# Patient Record
Sex: Male | Born: 1960 | Race: White | Hispanic: No | Marital: Single | Smoking: Former smoker
Health system: Southern US, Community
[De-identification: ages and names within clinical notes are randomized; demographics above are authoritative.]

## PROBLEM LIST (undated history)

## (undated) DIAGNOSIS — Z87442 Personal history of urinary calculi: Secondary | ICD-10-CM

## (undated) DIAGNOSIS — K219 Gastro-esophageal reflux disease without esophagitis: Secondary | ICD-10-CM

## (undated) DIAGNOSIS — S069X9A Unspecified intracranial injury with loss of consciousness of unspecified duration, initial encounter: Secondary | ICD-10-CM

## (undated) DIAGNOSIS — S069XAA Unspecified intracranial injury with loss of consciousness status unknown, initial encounter: Secondary | ICD-10-CM

## (undated) DIAGNOSIS — R569 Unspecified convulsions: Secondary | ICD-10-CM

## (undated) DIAGNOSIS — K759 Inflammatory liver disease, unspecified: Secondary | ICD-10-CM

## (undated) DIAGNOSIS — I10 Essential (primary) hypertension: Secondary | ICD-10-CM

## (undated) HISTORY — PX: HERNIA REPAIR: SHX51

---

## 2018-12-16 ENCOUNTER — Inpatient Hospital Stay (HOSPITAL_COMMUNITY)
Admission: EM | Admit: 2018-12-16 | Discharge: 2018-12-18 | DRG: 312 | Disposition: A | Discharge status: Home / Self Care | Payer: Medicaid - Out of State | Attending: Internal Medicine | Admitting: Internal Medicine

## 2018-12-16 ENCOUNTER — Other Ambulatory Visit: Payer: Self-pay

## 2018-12-16 ENCOUNTER — Encounter (HOSPITAL_COMMUNITY): Payer: Self-pay | Admitting: Emergency Medicine

## 2018-12-16 DIAGNOSIS — A084 Viral intestinal infection, unspecified: Secondary | ICD-10-CM | POA: Diagnosis present

## 2018-12-16 DIAGNOSIS — N400 Enlarged prostate without lower urinary tract symptoms: Secondary | ICD-10-CM

## 2018-12-16 DIAGNOSIS — N182 Chronic kidney disease, stage 2 (mild): Secondary | ICD-10-CM | POA: Diagnosis present

## 2018-12-16 DIAGNOSIS — E860 Dehydration: Secondary | ICD-10-CM

## 2018-12-16 DIAGNOSIS — Z87891 Personal history of nicotine dependence: Secondary | ICD-10-CM

## 2018-12-16 DIAGNOSIS — R42 Dizziness and giddiness: Secondary | ICD-10-CM

## 2018-12-16 DIAGNOSIS — I10 Essential (primary) hypertension: Secondary | ICD-10-CM

## 2018-12-16 DIAGNOSIS — I951 Orthostatic hypotension: Principal | ICD-10-CM

## 2018-12-16 DIAGNOSIS — Z87442 Personal history of urinary calculi: Secondary | ICD-10-CM

## 2018-12-16 DIAGNOSIS — K219 Gastro-esophageal reflux disease without esophagitis: Secondary | ICD-10-CM

## 2018-12-16 DIAGNOSIS — Z79899 Other long term (current) drug therapy: Secondary | ICD-10-CM

## 2018-12-16 DIAGNOSIS — Z8249 Family history of ischemic heart disease and other diseases of the circulatory system: Secondary | ICD-10-CM

## 2018-12-16 DIAGNOSIS — N200 Calculus of kidney: Secondary | ICD-10-CM

## 2018-12-16 DIAGNOSIS — I129 Hypertensive chronic kidney disease with stage 1 through stage 4 chronic kidney disease, or unspecified chronic kidney disease: Secondary | ICD-10-CM | POA: Diagnosis present

## 2018-12-16 DIAGNOSIS — N179 Acute kidney failure, unspecified: Secondary | ICD-10-CM

## 2018-12-16 HISTORY — DX: Personal history of urinary calculi: Z87.442

## 2018-12-16 HISTORY — DX: Gastro-esophageal reflux disease without esophagitis: K21.9

## 2018-12-16 HISTORY — DX: Essential (primary) hypertension: I10

## 2018-12-16 HISTORY — DX: Inflammatory liver disease, unspecified: K75.9

## 2018-12-16 LAB — COMPREHENSIVE METABOLIC PANEL
ALT: 12 U/L (ref 0–44)
AST: 14 U/L — ABNORMAL LOW (ref 15–41)
Albumin: 4.3 g/dL (ref 3.5–5.0)
Alkaline Phosphatase: 119 U/L (ref 38–126)
Anion gap: 11 (ref 5–15)
BUN: 27 mg/dL — ABNORMAL HIGH (ref 6–20)
CO2: 27 mmol/L (ref 22–32)
Calcium: 10.2 mg/dL (ref 8.9–10.3)
Chloride: 100 mmol/L (ref 98–111)
Creatinine, Ser: 1.68 mg/dL — ABNORMAL HIGH (ref 0.61–1.24)
GFR calc Af Amer: 51 mL/min — ABNORMAL LOW (ref >60)
GFR calc non Af Amer: 44 mL/min — ABNORMAL LOW (ref >60)
Glucose, Bld: 112 mg/dL — ABNORMAL HIGH (ref 70–99)
Potassium: 4.5 mmol/L (ref 3.5–5.1)
Sodium: 138 mmol/L (ref 135–145)
Total Bilirubin: 0.6 mg/dL (ref 0.3–1.2)
Total Protein: 8.2 g/dL — ABNORMAL HIGH (ref 6.5–8.1)

## 2018-12-16 LAB — CBC WITH DIFFERENTIAL/PLATELET
Abs Immature Granulocytes: 0.03 10*3/uL (ref 0.00–0.07)
Basophils Absolute: 0.1 10*3/uL (ref 0.0–0.1)
Basophils Relative: 1 %
Eosinophils Absolute: 0.2 10*3/uL (ref 0.0–0.5)
Eosinophils Relative: 3 %
HCT: 51.5 % (ref 39.0–52.0)
Hemoglobin: 17 g/dL (ref 13.0–17.0)
Immature Granulocytes: 0 %
Lymphocytes Relative: 22 %
Lymphs Abs: 1.8 10*3/uL (ref 0.7–4.0)
MCH: 29.8 pg (ref 26.0–34.0)
MCHC: 33 g/dL (ref 30.0–36.0)
MCV: 90.4 fL (ref 80.0–100.0)
Monocytes Absolute: 0.4 10*3/uL (ref 0.1–1.0)
Monocytes Relative: 6 %
Neutro Abs: 5.3 10*3/uL (ref 1.7–7.7)
Neutrophils Relative %: 68 %
Platelets: 246 10*3/uL (ref 150–400)
RBC: 5.7 MIL/uL (ref 4.22–5.81)
RDW: 12.8 % (ref 11.5–15.5)
WBC: 7.8 10*3/uL (ref 4.0–10.5)
nRBC: 0 % (ref 0.0–0.2)

## 2018-12-16 MED ORDER — ALUM & MAG HYDROXIDE-SIMETH 200-200-20 MG/5ML PO SUSP
30.0000 mL | ORAL | Status: DC | PRN
  Administered 2018-12-16 – 2018-12-17 (×3): 30 mL via ORAL
  Filled 2018-12-16 (×3): qty 30

## 2018-12-16 MED ORDER — ALUM & MAG HYDROXIDE-SIMETH 200-200-20 MG/5ML PO SUSP
30.0000 mL | Freq: Once | ORAL | Status: AC
  Administered 2018-12-16: 12:00:00 30 mL via ORAL

## 2018-12-16 MED ORDER — ALUM & MAG HYDROXIDE-SIMETH 200-200-20 MG/5ML PO SUSP
ORAL | Status: AC
  Filled 2018-12-16: qty 30

## 2018-12-16 MED ORDER — MECLIZINE HCL 12.5 MG PO TABS
25.0000 mg | ORAL_TABLET | Freq: Two times a day (BID) | ORAL | Status: DC
  Administered 2018-12-16 – 2018-12-18 (×4): 25 mg via ORAL
  Filled 2018-12-16 (×4): qty 2

## 2018-12-16 MED ORDER — ACETAMINOPHEN 325 MG PO TABS: 650.0000 mg | ORAL_TABLET | Freq: Four times a day (QID) | ORAL | Status: DC | PRN

## 2018-12-16 MED ORDER — POLYETHYLENE GLYCOL 3350 17 G PO PACK: 17.0000 g | PACK | Freq: Every day | ORAL | Status: DC | PRN

## 2018-12-16 MED ORDER — ENOXAPARIN SODIUM 40 MG/0.4ML ~~LOC~~ SOLN
40.0000 mg | SUBCUTANEOUS | Status: DC
  Administered 2018-12-16 – 2018-12-17 (×2): 40 mg via SUBCUTANEOUS
  Filled 2018-12-16: qty 0.4

## 2018-12-16 MED ORDER — SODIUM CHLORIDE 0.9 % IV SOLN
Freq: Once | INTRAVENOUS | Status: AC
  Administered 2018-12-16: 16:00:00 via INTRAVENOUS

## 2018-12-16 MED ORDER — SODIUM CHLORIDE 0.9 % IV BOLUS
500.0000 mL | Freq: Once | INTRAVENOUS | Status: AC
  Administered 2018-12-16: 12:00:00 500 mL via INTRAVENOUS

## 2018-12-16 MED ORDER — ACETAMINOPHEN 650 MG RE SUPP: 650.0000 mg | Freq: Four times a day (QID) | RECTAL | Status: DC | PRN

## 2018-12-16 MED ORDER — ALUM & MAG HYDROXIDE-SIMETH 200-200-20 MG/5ML PO SUSP
30.0000 mL | Freq: Once | ORAL | Status: AC
  Administered 2018-12-16: 17:00:00 30 mL via ORAL
  Filled 2018-12-16: qty 30

## 2018-12-16 MED ORDER — SODIUM CHLORIDE 0.9 % IV SOLN
INTRAVENOUS | Status: AC
  Administered 2018-12-16 – 2018-12-17 (×2): via INTRAVENOUS

## 2018-12-16 MED ORDER — SODIUM CHLORIDE 0.9 % IV BOLUS
500.0000 mL | Freq: Once | INTRAVENOUS | Status: AC
  Administered 2018-12-16: 13:00:00 500 mL via INTRAVENOUS

## 2018-12-16 MED ORDER — ONDANSETRON HCL 4 MG/2ML IJ SOLN
4.0000 mg | Freq: Four times a day (QID) | INTRAMUSCULAR | Status: DC | PRN
  Administered 2018-12-17: 4 mg via INTRAVENOUS
  Filled 2018-12-16: qty 2

## 2018-12-16 MED ORDER — SODIUM CHLORIDE 0.9 % IV BOLUS
1000.0000 mL | Freq: Once | INTRAVENOUS | Status: AC
  Administered 2018-12-16: 1000 mL via INTRAVENOUS

## 2018-12-16 MED ORDER — TRAMADOL HCL 50 MG PO TABS
50.0000 mg | ORAL_TABLET | Freq: Four times a day (QID) | ORAL | Status: DC | PRN
  Administered 2018-12-16 – 2018-12-17 (×2): 50 mg via ORAL
  Filled 2018-12-16 (×2): qty 1

## 2018-12-16 MED ORDER — AMITRIPTYLINE HCL 25 MG PO TABS
100.0000 mg | ORAL_TABLET | Freq: Every day | ORAL | Status: DC
  Administered 2018-12-16 – 2018-12-17 (×2): 100 mg via ORAL
  Filled 2018-12-16 (×2): qty 4

## 2018-12-16 MED ORDER — DIAZEPAM 5 MG PO TABS
10.0000 mg | ORAL_TABLET | Freq: Four times a day (QID) | ORAL | Status: DC | PRN
  Administered 2018-12-16 – 2018-12-17 (×2): 10 mg via ORAL
  Filled 2018-12-16 (×2): qty 2

## 2018-12-16 MED ORDER — ONDANSETRON HCL 4 MG PO TABS: 4.0000 mg | ORAL_TABLET | Freq: Four times a day (QID) | ORAL | Status: DC | PRN

## 2018-12-16 MED ORDER — TRAMADOL HCL 50 MG PO TABS: 50.0000 mg | ORAL_TABLET | Freq: Four times a day (QID) | ORAL | Status: DC | PRN

## 2018-12-16 MED ORDER — MECLIZINE HCL 12.5 MG PO TABS
25.0000 mg | ORAL_TABLET | Freq: Once | ORAL | Status: DC
  Administered 2018-12-16: 12:00:00 25 mg via ORAL
  Filled 2018-12-16: qty 2

## 2018-12-16 MED ORDER — PANTOPRAZOLE SODIUM 40 MG PO TBEC
40.0000 mg | DELAYED_RELEASE_TABLET | Freq: Every day | ORAL | Status: DC
  Administered 2018-12-16 – 2018-12-18 (×3): 40 mg via ORAL
  Filled 2018-12-16 (×3): qty 1

## 2018-12-16 NOTE — ED Provider Notes (Signed)
[facility] EMERGENCY DEPARTMENT Provider Note   CSN: [id number] Arrival date & time: 12/16/18  1055    History   Chief Complaint Chief Complaint  Patient presents with  . Dizziness    HPI Javier Rose is a 58 y.o. male.     HPI Patient states he has had 3 days of dizziness which he describes as spinning sensation.  Worse with standing.  He has had bilateral tinnitus.  Denies focal weakness or numbness.  States he has been more tremulous and had a dry mouth.  Quit smoking roughly 3 weeks ago.  States at that time he was having some increased anxiety but that has since resolved.  Denies any new medications or changing of dosing of medications.  Denies alcohol or other drug intake.  No fever or chills.  Denies nausea or vomiting. History reviewed. No pertinent past medical history.  There are no active problems to display for this patient.   History reviewed. No pertinent surgical history.      Home Medications    Prior to Admission medications   Medication Sig Start Date End Date Taking? Authorizing Provider  amitriptyline (ELAVIL) 100 MG tablet Take 100 mg by mouth at bedtime.   Yes [provider]  chlorthalidone (HYGROTON) 50 MG tablet Take 50 mg by mouth daily.   Yes [provider]  diazepam (VALIUM) 10 MG tablet Take 10 mg by mouth every 6 (six) hours as needed for anxiety.   Yes [provider]  esomeprazole (NEXIUM) 20 MG capsule Take 20 mg by mouth daily at 12 noon.   Yes [provider]  lisinopril (PRINIVIL,ZESTRIL) 20 MG tablet Take 20 mg by mouth daily.   Yes [provider]    Family History History reviewed. No pertinent family history.  Social History Social History   Tobacco Use  . Smoking status: Former Games developermoker  . Smokeless tobacco: Never Used  Substance Use Topics  . Alcohol use: Never    Frequency: Never  . Drug use: Never     Allergies   Patient has no allergy information on record.    Review of Systems Review of Systems  Constitutional: Negative for chills, fatigue and fever.  HENT: Positive for tinnitus. Negative for sore throat.   Eyes: Negative for visual disturbance.  Respiratory: Negative for cough and shortness of breath.   Cardiovascular: Negative for chest pain.  Gastrointestinal: Negative for abdominal pain, diarrhea, nausea and vomiting.  Genitourinary: Negative for dysuria, flank pain and frequency.  Musculoskeletal: Positive for gait problem. Negative for back pain, myalgias and neck pain.  Skin: Negative for rash and wound.  Neurological: Positive for dizziness and light-headedness. Negative for speech difficulty, weakness, numbness and headaches.  All other systems reviewed and are negative.    Physical Exam Updated Vital Signs BP 103/81   Pulse 64   Temp 97.7 F (36.5 C) (Oral)   Resp 16   Ht 5\' 9"  (1.753 m)   Wt 77.1 kg   SpO2 98%   BMI 25.10 kg/m   Physical Exam Vitals signs and nursing note reviewed.  Constitutional:      Appearance: Normal appearance. He is well-developed.  HENT:     Head: Normocephalic and atraumatic.     Right Ear: Tympanic membrane normal.     Left Ear: Tympanic membrane normal.     Nose: Nose normal.     Mouth/Throat:     Mouth: Mucous membranes are dry.  Eyes:     Extraocular Movements:  Extraocular movements intact.     Pupils: Pupils are equal, round, and reactive to light.     Comments: Few beats of horizontal nystagmus especially when looking left.  Neck:     Musculoskeletal: Normal range of motion and neck supple. No neck rigidity or muscular tenderness.  Cardiovascular:     Rate and Rhythm: Normal rate and regular rhythm.     Heart sounds: No murmur. No friction rub. No gallop.   Pulmonary:     Effort: Pulmonary effort is normal. No respiratory distress.     Breath sounds: Normal breath sounds. No stridor. No wheezing, rhonchi or rales.  Chest:     Chest wall: No tenderness.  Abdominal:      General: Bowel sounds are normal.     Palpations: Abdomen is soft.     Tenderness: There is no abdominal tenderness. There is no guarding or rebound.  Musculoskeletal: Normal range of motion.        General: No swelling, tenderness, deformity or signs of injury.     Right lower leg: No edema.     Left lower leg: No edema.  Lymphadenopathy:     Cervical: No cervical adenopathy.  Skin:    General: Skin is warm and dry.     Findings: No erythema or rash.  Neurological:     General: No focal deficit present.     Mental Status: He is alert and oriented to person, place, and time.     Comments: Patient is alert and oriented x3 with clear, goal oriented speech. Patient has 5/5 motor in all extremities. Sensation is intact to light touch. Bilateral finger-to-nose is normal with no signs of dysmetria.   Psychiatric:        Behavior: Behavior normal.     Comments: Mildly anxious appearing      ED Treatments / Results  Labs (all labs ordered are listed, but only abnormal results are displayed) Labs Reviewed  COMPREHENSIVE METABOLIC PANEL - Abnormal; Notable for the following components:      Result Value   Glucose, Bld 112 (*)    BUN 27 (*)    Creatinine, Ser 1.68 (*)    Total Protein 8.2 (*)    AST 14 (*)    GFR calc non Af Amer 44 (*)    GFR calc Af Amer 51 (*)    All other components within normal limits  CBC WITH DIFFERENTIAL/PLATELET    EKG EKG Interpretation  Date/Time:  Tuesday December 16 2018 11:00:56 EDT Ventricular Rate:  60 PR Interval:    QRS Duration: 112 QT Interval:  459 QTC Calculation: 459 R Axis:   89 Text Interpretation:  Sinus rhythm Short PR interval Consider right atrial enlargement Probable lateral infarct, old Artifact in lead(s) I III aVR aVL Confirmed by Loren Racer ([id number]) on 12/16/2018 11:38:54 AM   Radiology No results found.  Procedures Procedures (including critical care time)  Medications Ordered in ED Medications  meclizine  (ANTIVERT) tablet 25 mg (25 mg Oral Given 12/16/18 1136)  sodium chloride 0.9 % bolus 500 mL (0 mLs Intravenous Stopped 12/16/18 1223)  alum & mag hydroxide-simeth (MAALOX/MYLANTA) 200-200-20 MG/5ML suspension 30 mL (30 mLs Oral Given 12/16/18 1211)  sodium chloride 0.9 % bolus 500 mL (0 mLs Intravenous Stopped 12/16/18 1349)  sodium chloride 0.9 % bolus 1,000 mL (0 mLs Intravenous Stopped 12/16/18 1520)  0.9 %  sodium chloride infusion ( Intravenous New Bag/Given 12/16/18 1531)     Initial Impression / Assessment and  Plan / ED Course  I have reviewed the triage vital signs and the nursing notes.  Pertinent labs & imaging results that were available during my care of the patient were reviewed by me and considered in my medical decision making (see chart for details).        Patient was profoundly orthostatic with standing.  Given 2 L of IV fluids.  Continues to be orthostatic with systolic blood pressure dropping into the 70s.  Patient does have an AKI.  Unsure his baseline renal function.  Discussed with hospitalist and will see patient in the emergency department.  Symptoms likely related to dehydration.  Final Clinical Impressions(s) / ED Diagnoses   Final diagnoses:  Orthostatic hypotension  AKI (acute kidney injury) [facility])  Dehydration    ED Discharge Orders    None       Loren Racer, MD 12/16/18 1545

## 2018-12-16 NOTE — ED Triage Notes (Signed)
Pt c/o of dizziness and shaking x 3 days.  Pt states he stopped smoking 3 weeks ago. VSS

## 2018-12-16 NOTE — ED Notes (Signed)
Dr. Ranae Palms notified of BP and +orthostatis, started 2nd 500 ml bolus

## 2018-12-16 NOTE — H&P (Signed)
History and Physical    Javier Rose [id number] DOB: Nov 13, 1960 DOA: 12/16/2018  PCP: Patient, No Pcp Per   Patient coming from: Home  I have personally briefly [facility]  Chief Complaint: Dizziness  HPI: Javier Cashommy Becht is a 58 y.o. male with medical history significant for HTN, presented to the Ed with c/o 2 weeks of dizziness. Dizziness is present only when standing. No extremity weakness. No vomiting or loose stools. He has maintained good PO intake except for the past 2 days. No chest pain. He denies sick contacts, no fever,no cough or shortness of breath, no sore-throat, he has been home for the past 2 weeks except for grocery shopping.  ED Course: Initial stable vitals, but with orthostatic hypotension. Blood pressure systolic dropped from 128/93 lying to 65/42 standing.  Unremarkable CBC.  Creatinine elevated 1.68, BUN- 27.  Shows Q waves lead II, III, with artifacts.  Patient given 1.5 L bolus, 25 mg meclizine, repeat orthostatics showed blood pressure drop to 72/58 on standing.  Hospitalist to admit for symptomatic orthostatic hypotension and probable acute kidney injury.  Review of Systems: As per HPI all other systems reviewed and negative.  Medical history-hypertension, tobacco abuse.  History reviewed. No pertinent surgical history.   reports that he has quit smoking. He has never used smokeless tobacco. He reports that he does not drink alcohol or use drugs.  Not on File  Family history of HTN.  Prior to Admission medications   Medication Sig Start Date End Date Taking? Authorizing Provider  amitriptyline (ELAVIL) 100 MG tablet Take 100 mg by mouth at bedtime.   Yes [provider]  chlorthalidone (HYGROTON) 50 MG tablet Take 50 mg by mouth daily.   Yes [provider]  diazepam (VALIUM) 10 MG tablet Take 10 mg by mouth every 6 (six) hours as needed for anxiety.   Yes [provider]   esomeprazole (NEXIUM) 20 MG capsule Take 20 mg by mouth daily at 12 noon.   Yes [provider]  lisinopril (PRINIVIL,ZESTRIL) 20 MG tablet Take 20 mg by mouth daily.   Yes [provider]    Physical Exam: Vitals:   12/16/18 1101 12/16/18 1300 12/16/18 1500 12/16/18 1530  BP: (!) 133/102 (!) 158/83 (!) 156/87 103/81  Pulse: 64     Resp: 16 19  16   Temp: 97.7 F (36.5 C)     TempSrc: Oral     SpO2: 98%     Weight:      Height:        Constitutional: NAD, calm, comfortable Vitals:   12/16/18 1101 12/16/18 1300 12/16/18 1500 12/16/18 1530  BP: (!) 133/102 (!) 158/83 (!) 156/87 103/81  Pulse: 64     Resp: 16 19  16   Temp: 97.7 F (36.5 C)     TempSrc: Oral     SpO2: 98%     Weight:      Height:       Eyes: PERRL, lids and conjunctivae normal ENMT: Mucous membranes are moist. Posterior pharynx clear of any exudate or lesions. Neck: normal, supple, no masses, no thyromegaly Respiratory: Normal respiratory effort. No accessory muscle use.  Cardiovascular: Regular rate and rhythm, No extremity edema. 2+ pedal pulses. Abdomen: no tenderness, no masses palpated. No hepatosplenomegaly. Bowel sounds positive.  Musculoskeletal: no clubbing / cyanosis. No joint deformity upper and lower extremities. Good ROM, no contractures. Normal muscle tone.  Skin: no rashes, lesions, ulcers. No induration Neurologic:  CN 2-12 grossly intact. Sensation intact, DTR normal. Strength 5/5 in all 4.  Psychiatric: Normal judgment and insight. Alert and oriented x 3. Normal mood.   Labs on Admission: I have personally reviewed following labs and imaging studies  CBC: Recent Labs  Lab 12/16/18 1130  WBC 7.8  NEUTROABS 5.3  HGB 17.0  HCT 51.5  MCV 90.4  PLT 246   Basic Metabolic Panel: Recent Labs  Lab 12/16/18 1130  NA 138  K 4.5  CL 100  CO2 27  GLUCOSE 112*  BUN 27*  CREATININE 1.68*  CALCIUM 10.2   Liver Function Tests: Recent Labs  Lab 12/16/18 1130  AST  14*  ALT 12  ALKPHOS 119  BILITOT 0.6  PROT 8.2*  ALBUMIN 4.3    Radiological Exams on Admission: No results found.  EKG: Independently reviewed.  Artifacts present.  Sinus rhythm.  QTc 459.  Q waves 2 3 aVF.  T waves appear peaked diffusely but no old EKG to compare.  Assessment/Plan Active Problems:   Dizziness   Dizziness- likely secondary to orthostatic hypotension, likely from diuretics. 1.5L fluids given in ED, with persistent orthostasis- initial blood pressure 128/93 pulse 63 lying, and standing 65/42 pulse 82.  No focal neurologic deficits. -Hold home chlorthalidone 50 and lisinopril 20mg  -Continue IV fluid normal saline 125 cc/h X 15hrs -PT evaluation if available -Continue meclizine as needed  Probable acute kidney injury-no baseline to compare.  Creatinine 1.68, BUN 27. Denies kidney disease, but tells me he has had several kidney stones and BPH. -Hold chlorthalidone and lisinopril -Hydrate -BMP a.m.  Hypertension-Systolic 103 to 150s. -Hold home antihypertensives at this time  Tobacco abuse-quit smoking 3 weeks ago.  DVT prophylaxis: Lovenox Code Status: Full Family Communication: None at bedside Disposition Plan: 1-2 days Consults called: None Admission status: Obs, Med-surg   Onnie Boer MD [facility]  12/16/2018, 3:57 PM

## 2018-12-17 DIAGNOSIS — I129 Hypertensive chronic kidney disease with stage 1 through stage 4 chronic kidney disease, or unspecified chronic kidney disease: Secondary | ICD-10-CM | POA: Diagnosis present

## 2018-12-17 DIAGNOSIS — K219 Gastro-esophageal reflux disease without esophagitis: Secondary | ICD-10-CM | POA: Diagnosis present

## 2018-12-17 DIAGNOSIS — A084 Viral intestinal infection, unspecified: Secondary | ICD-10-CM | POA: Diagnosis present

## 2018-12-17 DIAGNOSIS — I951 Orthostatic hypotension: Principal | ICD-10-CM

## 2018-12-17 DIAGNOSIS — N200 Calculus of kidney: Secondary | ICD-10-CM

## 2018-12-17 DIAGNOSIS — N182 Chronic kidney disease, stage 2 (mild): Secondary | ICD-10-CM | POA: Diagnosis present

## 2018-12-17 DIAGNOSIS — N179 Acute kidney failure, unspecified: Secondary | ICD-10-CM | POA: Diagnosis not present

## 2018-12-17 DIAGNOSIS — E860 Dehydration: Secondary | ICD-10-CM

## 2018-12-17 DIAGNOSIS — Z79899 Other long term (current) drug therapy: Secondary | ICD-10-CM | POA: Diagnosis not present

## 2018-12-17 DIAGNOSIS — I10 Essential (primary) hypertension: Secondary | ICD-10-CM

## 2018-12-17 DIAGNOSIS — Z87442 Personal history of urinary calculi: Secondary | ICD-10-CM | POA: Diagnosis not present

## 2018-12-17 DIAGNOSIS — N400 Enlarged prostate without lower urinary tract symptoms: Secondary | ICD-10-CM | POA: Diagnosis not present

## 2018-12-17 DIAGNOSIS — Z8249 Family history of ischemic heart disease and other diseases of the circulatory system: Secondary | ICD-10-CM | POA: Diagnosis not present

## 2018-12-17 DIAGNOSIS — R42 Dizziness and giddiness: Secondary | ICD-10-CM | POA: Diagnosis not present

## 2018-12-17 DIAGNOSIS — Z87891 Personal history of nicotine dependence: Secondary | ICD-10-CM | POA: Diagnosis not present

## 2018-12-17 LAB — BASIC METABOLIC PANEL
Anion gap: 8 (ref 5–15)
BUN: 26 mg/dL — ABNORMAL HIGH (ref 6–20)
CO2: 27 mmol/L (ref 22–32)
Calcium: 9 mg/dL (ref 8.9–10.3)
Chloride: 105 mmol/L (ref 98–111)
Creatinine, Ser: 1.55 mg/dL — ABNORMAL HIGH (ref 0.61–1.24)
GFR calc Af Amer: 56 mL/min — ABNORMAL LOW (ref >60)
GFR calc non Af Amer: 49 mL/min — ABNORMAL LOW (ref >60)
Glucose, Bld: 109 mg/dL — ABNORMAL HIGH (ref 70–99)
Potassium: 4.6 mmol/L (ref 3.5–5.1)
Sodium: 140 mmol/L (ref 135–145)

## 2018-12-17 LAB — HIV ANTIBODY (ROUTINE TESTING W REFLEX): HIV Screen 4th Generation wRfx: NONREACTIVE

## 2018-12-17 MED ORDER — SACCHAROMYCES BOULARDII 250 MG PO CAPS
250.0000 mg | ORAL_CAPSULE | Freq: Two times a day (BID) | ORAL | Status: DC
  Administered 2018-12-17 – 2018-12-18 (×3): 250 mg via ORAL
  Filled 2018-12-17 (×3): qty 1

## 2018-12-17 MED ORDER — SODIUM CHLORIDE 0.9 % IV SOLN
INTRAVENOUS | Status: AC
  Administered 2018-12-17 – 2018-12-18 (×2): via INTRAVENOUS

## 2018-12-17 NOTE — Evaluation (Signed)
Physical Therapy Evaluation Patient Details Name: Javier Rose MRN: [id number] DOB: 01-Nov-1960 Today's Date: 12/17/2018   History of Present Illness  Javier Rose is a 58 y.o. male with medical history significant for HTN, presented to the Ed with c/o 2 weeks of dizziness. Dizziness is present only when standing. No extremity weakness. No vomiting or loose stools. He has maintained good PO intake except for the past 2 days. No chest pain. He denies sick contacts, no fever,no cough or shortness of breath, no sore-throat, he has been home for the past 2 weeks except for grocery shopping.    Clinical Impression  Patient functioning at baseline for functional mobility and gait, demonstrates safe ambulation in room and hallways and tolerated sitting up in chair after therapy - RN notified.  Plan:  Patient discharged from physical therapy to care of nursing for ambulation daily as tolerated for length of stay.     Follow Up Recommendations No PT follow up    Equipment Recommendations  None recommended by PT    Recommendations for Other Services       Precautions / Restrictions Precautions Precautions: None Restrictions Weight Bearing Restrictions: No      Mobility  Bed Mobility Overal bed mobility: Independent                Transfers Overall transfer level: Independent                  Ambulation/Gait Ambulation/Gait assistance: Modified independent (Device/Increase time) Gait Distance (Feet): 200 Feet Assistive device: None Gait Pattern/deviations: Decreased dorsiflexion - right;WFL(Within Functional Limits);Steppage Gait velocity: slightly decreased   General Gait Details: grossly WFL except mild right foot slap due to baseline weakness in right ankle dorsiflexors, no loss of balance  Stairs            Wheelchair Mobility    Modified Rankin (Stroke Patients Only)       Balance Overall balance assessment: No apparent balance deficits (not  formally assessed)                                           Pertinent Vitals/Pain Pain Assessment: No/denies pain    Home Living Family/patient expects to be discharged to:: Private residence Living Arrangements: Other relatives(sister) Available Help at Discharge: Family;Available PRN/intermittently Type of Home: Mobile home Home Access: Stairs to enter Entrance Stairs-Rails: Right Entrance Stairs-Number of Steps: 2 Home Layout: One level Home Equipment: None      Prior Function Level of Independence: Independent               Hand Dominance        Extremity/Trunk Assessment   Upper Extremity Assessment Upper Extremity Assessment: Overall WFL for tasks assessed    Lower Extremity Assessment Lower Extremity Assessment: Overall WFL for tasks assessed;RLE deficits/detail RLE Deficits / Details: ankle dorsiflexion grossly 3/5 (base line since fall over 1 year ago per patient)    Cervical / Trunk Assessment Cervical / Trunk Assessment: Normal  Communication   Communication: No difficulties  Cognition Arousal/Alertness: Awake/alert Behavior During Therapy: WFL for tasks assessed/performed Overall Cognitive Status: Within Functional Limits for tasks assessed                                        General Comments  Exercises     Assessment/Plan    PT Assessment Patent does not need any further PT services  PT Problem List         PT Treatment Interventions      PT Goals (Current goals can be found in the Care Plan section)  Acute Rehab PT Goals Patient Stated Goal: return home  PT Goal Formulation: With patient Time For Goal Achievement: 12/17/18 Potential to Achieve Goals: Good    Frequency     Barriers to discharge        Co-evaluation               AM-PAC PT "6 Clicks" Mobility  Outcome Measure Help needed turning from your back to your side while in a flat bed without using bedrails?:  None Help needed moving from lying on your back to sitting on the side of a flat bed without using bedrails?: None Help needed moving to and from a bed to a chair (including a wheelchair)?: None Help needed standing up from a chair using your arms (e.g., wheelchair or bedside chair)?: None Help needed to walk in hospital room?: None Help needed climbing 3-5 steps with a railing? : A Little 6 Click Score: 23    End of Session   Activity Tolerance: Patient tolerated treatment well Patient left: in chair;with call bell/phone within reach Nurse Communication: Mobility status PT Visit Diagnosis: Unsteadiness on feet (R26.81);Other abnormalities of gait and mobility (R26.89);Muscle weakness (generalized) (M62.81)    Time: 3329-5188 PT Time Calculation (min) (ACUTE ONLY): 29 min   Charges:   PT Evaluation $PT Eval Moderate Complexity: 1 Mod PT Treatments $Therapeutic Activity: 23-37 mins        9:06 AM, 12/17/18 Ocie Bob, MPT Physical Therapist with [facility] 336 [phone] office [phone] mobile phone

## 2018-12-17 NOTE — Progress Notes (Signed)
Patient has had four liquid stools and one vomiting occurrence this shift. Patient denies dizziness or pain at this time. Dr. Antionette Char notified. Will continue to monitor.

## 2018-12-17 NOTE — Progress Notes (Signed)
Only one loose stool today which was around noon and sent to lab. Denies any vomiting since 0400 this morning.  Eating peanut butter crackers and drinking juice and sprite now. Stated not feeling dizzy as he had at home.

## 2018-12-17 NOTE — Progress Notes (Addendum)
PROGRESS NOTE    Javier Rose  [id number] DOB: April 20, 1961 DOA: 12/16/2018 PCP: Gasper SellsKramer, Ralph, MD     Brief Narrative:  58 y.o. male with medical history significant for HTN, presented to the Ed with c/o 2 weeks of dizziness. Dizziness is present only when standing. No extremity weakness. No vomiting or loose stools. He has maintained good PO intake except for the past 2 days. No chest pain. He denies sick contacts, no fever,no cough or shortness of breath, no sore-throat, he has been home for the past 2 weeks except for grocery shopping.  ED Course: Initial stable vitals, but with orthostatic hypotension. Blood pressure systolic dropped from 128/93 lying to 65/42 standing.  Unremarkable CBC.  Creatinine elevated 1.68, BUN- 27.  Shows Q waves lead II, III, with artifacts.  Patient given 1.5 L bolus, 25 mg meclizine, repeat orthostatics showed blood pressure drop to 72/58 on standing.  Hospitalist to admit for symptomatic orthostatic hypotension and probable acute kidney injury.  Assessment & Plan: 1-Dizziness: In the setting of orthostatic hypotension -Most likely due to underlying dehydration and continue use of antihypertensive agents including diuretics. -Will continue holding antihypertensive medications for now -Continue IV fluids -Reassess orthostatic vital signs in a.m. -Advance diet as tolerated and ask patient to maintain oral hydration.  2-HTN (hypertension) -Currently stable -Will continue monitoring of antihypertensive agents. -Continue IV fluids.  3-nausea/vomiting and diarrhea -Appears to be associated with viral gastroenteritis -Will check a stool study for GI panel -Continue IV fluids -Advance diet as tolerated -Use as needed antiemetics.  4-prior history of Kidney stones -Denies hematuria or flank pain -Advised to maintain adequate hydration.  5-history of BPH (benign prostatic hyperplasia) -Patient denies symptoms of urinary retention -No medications  prior to admission for BPH. -Continue monitoring  6-AKI -unclear is acute ; vs acute superimposed on CKD -will continue IVF's -continue holding nephrotoxic agents -follow renal function trend    DVT prophylaxis: Lovenox Code Status: Full code Family Communication: No family at bedside. Disposition Plan: Continue IV fluids, check stool studies, provide as needed antiemetics and advance diet as tolerated.  Reassess basic metabolic panel in a.m.  Consultants:   None  Procedures:   None  Antimicrobials:  Anti-infectives (From admission, onward)   None      Subjective: Afebrile, no chest pain, no focal weakness.  Reports nausea/vomiting and diarrhea episodes.  Feeling generally weak and expressing difficulty sleeping overnight.  Objective: Vitals:   12/16/18 2119 12/17/18 0512 12/17/18 0515 12/17/18 1311  BP: (!) 154/89 (!) 178/94 (!) 162/81 (!) 164/71  Pulse: 74 64 (!) 58 88  Resp: 20 16    Temp: 98.3 F (36.8 C) (!) 97.4 F (36.3 C)  97.6 F (36.4 C)  TempSrc: Oral Oral  Oral  SpO2: 97% 100% 99% 95%  Weight:      Height:        Intake/Output Summary (Last 24 hours) at 12/17/2018 1621 Last data filed at 12/17/2018 0837 Gross per 24 hour  Intake 4796.59 ml  Output 0 ml  Net 4796.59 ml   Filed Weights   12/16/18 1059 12/16/18 1742  Weight: 77.1 kg 73.9 kg    Examination: General exam: Alert, awake, oriented x 3; reports feeling lightheaded and generally weak.  Actively having nausea and diarrhea.  Patient also report one episode of vomiting early this morning. Respiratory system: Clear to auscultation. Respiratory effort normal. Cardiovascular system:RRR. No murmurs, rubs, gallops. Gastrointestinal system: Abdomen is nondistended, soft and nontender. No organomegaly or masses felt. Normal  bowel sounds heard. Central nervous system: Alert and oriented. No focal neurological deficits. Extremities: No C/C/E, +pedal pulses Skin: No rashes, lesions or ulcers  Psychiatry: Judgement and insight appear normal. Mood & affect appropriate.     Data Reviewed: I have personally reviewed following labs and imaging studies  CBC: Recent Labs  Lab 12/16/18 1130  WBC 7.8  NEUTROABS 5.3  HGB 17.0  HCT 51.5  MCV 90.4  PLT 246   Basic Metabolic Panel: Recent Labs  Lab 12/16/18 1130 12/17/18 0429  NA 138 140  K 4.5 4.6  CL 100 105  CO2 27 27  GLUCOSE 112* 109*  BUN 27* 26*  CREATININE 1.68* 1.55*  CALCIUM 10.2 9.0   GFR: Estimated Creatinine Clearance: 51.9 mL/min (A) (by C-G formula based on SCr of 1.55 mg/dL (H)).   Liver Function Tests: Recent Labs  Lab 12/16/18 1130  AST 14*  ALT 12  ALKPHOS 119  BILITOT 0.6  PROT 8.2*  ALBUMIN 4.3   Scheduled Meds: . amitriptyline  100 mg Oral QHS  . enoxaparin (LOVENOX) injection  40 mg Subcutaneous Q24H  . meclizine  25 mg Oral BID  . pantoprazole  40 mg Oral Daily  . saccharomyces boulardii  250 mg Oral BID   Continuous Infusions: . sodium chloride       LOS: 0 days    Time spent: 30 minutes   Vassie Loll, MD [facility] Pager [phone]   12/17/2018, 4:21 PM

## 2018-12-17 NOTE — TOC Initial Note (Signed)
Transition of Care [facility]) - Initial/Assessment Note    Patient Details  Name: Javier Rose MRN: [id number] Date of Birth: 20-Nov-1960  Transition [facility]) CM/SW Contact:    Sharena Dibenedetto, Chrystine Oiler, RN Phone Number: 12/17/2018, 11:06 AM  Clinical Narrative:    Dizziness. From home, independent. No assistive devices. Has PCP, still drives. No issues obtaining medications. Moved from [place], [place] to live with his sister in [place]. Provided local PCP list ,  however at this time patient is still able to see his MD in [place]. Seen by PT, no recommendations. No TOC needs at this time.    Expected Discharge Plan: Home/Self Care Barriers to Discharge: No Barriers Identified        Expected Discharge Plan and Services Expected Discharge Plan: Home/Self Care         Expected Discharge Date: 12/18/18                        Prior Living Arrangements/Services   Lives with:: Siblings Patient language and need for interpreter reviewed:: Yes Do you feel safe going back to the place where you live?: Yes      Need for Family Participation in Patient Care: No (Comment) Care giver support system in place?: No (comment)   Criminal Activity/Legal Involvement Pertinent to Current Situation/Hospitalization: No - Comment as needed  Activities of Daily Living Home Assistive Devices/Equipment: Blood pressure cuff ADL Screening (condition at time of admission) Patient's cognitive ability adequate to safely complete daily activities?: Yes Is the patient deaf or have difficulty hearing?: No Does the patient have difficulty seeing, even when wearing glasses/contacts?: No Does the patient have difficulty concentrating, remembering, or making decisions?: No Patient able to express need for assistance with ADLs?: Yes Does the patient have difficulty dressing or bathing?: No Independently performs ADLs?: Yes (appropriate for developmental age) Communication: Independent Dressing (OT):  Independent Grooming: Independent Feeding: Independent Bathing: Independent Toileting: Independent In/Out Bed: Independent Walks in Home: Independent Does the patient have difficulty walking or climbing stairs?: No Weakness of Legs: None Weakness of Arms/Hands: None         Emotional Assessment Appearance:: Appears stated age Attitude/Demeanor/Rapport: Engaged Affect (typically observed): Accepting, Calm Orientation: : Oriented to Self, Oriented to Place, Oriented to  Time, Oriented to Situation Alcohol / Substance Use: Not Applicable Psych Involvement: No (comment)  Admission diagnosis:  Orthostatic hypotension [I95.1] Dehydration [E86.0] AKI (acute kidney injury) (HCC) [N17.9] Patient Active Problem List   Diagnosis Date Noted  . Dizziness 12/16/2018  . HTN (hypertension) 12/16/2018  . Kidney stones 12/16/2018  . BPH (benign prostatic hyperplasia) 12/16/2018   PCP:   Pharmacy:   [facility] [phone] - [place], [place] - [address] [place] [address] Phone: [phone] Fax: [phone]   Readmission Risk Interventions No flowsheet data found.

## 2018-12-17 NOTE — Progress Notes (Addendum)
C/O diarrhea.  Dr. Gwenlyn Perking ordered GI panel and Norovirus and florastor.  Hat placed in toilet

## 2018-12-18 DIAGNOSIS — E860 Dehydration: Secondary | ICD-10-CM

## 2018-12-18 DIAGNOSIS — K219 Gastro-esophageal reflux disease without esophagitis: Secondary | ICD-10-CM

## 2018-12-18 DIAGNOSIS — I951 Orthostatic hypotension: Secondary | ICD-10-CM

## 2018-12-18 DIAGNOSIS — N179 Acute kidney failure, unspecified: Secondary | ICD-10-CM

## 2018-12-18 LAB — BASIC METABOLIC PANEL
Anion gap: 7 (ref 5–15)
BUN: 24 mg/dL — ABNORMAL HIGH (ref 6–20)
CO2: 27 mmol/L (ref 22–32)
Calcium: 8.4 mg/dL — ABNORMAL LOW (ref 8.9–10.3)
Chloride: 108 mmol/L (ref 98–111)
Creatinine, Ser: 1.43 mg/dL — ABNORMAL HIGH (ref 0.61–1.24)
GFR calc Af Amer: >60 mL/min (ref >60)
GFR calc non Af Amer: 54 mL/min — ABNORMAL LOW (ref >60)
Glucose, Bld: 74 mg/dL (ref 70–99)
Potassium: 3.9 mmol/L (ref 3.5–5.1)
Sodium: 142 mmol/L (ref 135–145)

## 2018-12-18 LAB — GASTROINTESTINAL PANEL BY PCR, STOOL (REPLACES STOOL CULTURE)

## 2018-12-18 MED ORDER — SACCHAROMYCES BOULARDII 250 MG PO CAPS: 250.0000 mg | ORAL_CAPSULE | Freq: Two times a day (BID) | ORAL | 0 refills | Status: DC

## 2018-12-18 MED ORDER — AMLODIPINE BESYLATE 10 MG PO TABS: 10.0000 mg | ORAL_TABLET | Freq: Every day | ORAL | 3 refills | Status: DC

## 2018-12-18 MED ORDER — MECLIZINE HCL 25 MG PO TABS: 25.0000 mg | ORAL_TABLET | Freq: Two times a day (BID) | ORAL | 0 refills | Status: DC | PRN

## 2018-12-18 NOTE — Progress Notes (Signed)
No c/o dizziness and no bms since the one yesterday.   IV removed and discharge instructions reviewed.  Sister to give ride home

## 2018-12-18 NOTE — Discharge Summary (Signed)
Physician Discharge Summary  Javier Rose [id number] DOB: 10-16-1960 DOA: 12/16/2018  PCP: Gasper SellsKramer, Ralph, MD  Admit date: 12/16/2018 Discharge date: 12/18/2018  Time spent: 35 minutes  Recommendations for Outpatient Follow-up:  1. Repeat basic metabolic panel to follow electrolytes and renal function. 2. Reassess blood pressure and further adjust antihypertensive regimen as needed.   Discharge Diagnoses:  Principal Problem:   Dizziness Active Problems:   HTN (hypertension)   Kidney stones   BPH (benign prostatic hyperplasia)   AKI (acute kidney injury) (HCC)   Dehydration   Orthostatic hypotension   Gastroesophageal reflux disease   Discharge Condition: Stable and improved.  Patient has been discharged home with instruction to establish care with PCP in 2 weeks.  Diet recommendation: Heart healthy diet.  Filed Weights   12/16/18 1059 12/16/18 1742  Weight: 77.1 kg 73.9 kg    History of present illness:  As per H&P written by Dr. Mariea ClontsEmokpae on 12/16/2018 58 y.o. male with medical history significant for HTN, presented to the Ed with c/o 2 weeks of dizziness. Dizziness is present only when standing. No extremity weakness. No vomiting or loose stools. He has maintained good PO intake except for the past 2 days. No chest pain. He denies sick contacts, no fever,no cough or shortness of breath, no sore-throat, he has been home for the past 2 weeks except for grocery shopping.  ED Course: Initial stable vitals, but with orthostatic hypotension. Blood pressure systolic dropped from 128/93 lying to 65/42 standing.  Unremarkable CBC.  Creatinine elevated 1.68, BUN- 27.  Shows Q waves lead II, III, with artifacts.  Patient given 1.5 L bolus, 25 mg meclizine, repeat orthostatics showed blood pressure drop to 72/58 on standing.  Hospitalist to admit for symptomatic orthostatic hypotension and probable acute kidney injury.  Hospital Course:  1-Dizziness: In the setting of orthostatic  hypotension -Most likely due to underlying dehydration and continue use of antihypertensive agents including diuretics. -lisinopril and chlorthalidone discontinue at discharge. -Will start amlodipine 10 mg by mouth daily. -Advised to maintain adequate hydration; will use as needed meclizine. -No further orthostatic abnormalities appreciated. -Diet advanced and well-tolerated.  2-HTN (hypertension) -Currently stable well-controlled. -Will discharge using amlodipine 10 mg by mouth daily -Patient advised to maintain adequate hydration and to follow low-sodium diet. -Reassess blood pressure and further adjust antihypertensive regimen as needed.  3-nausea/vomiting and diarrhea -Appears to be associated with viral gastroenteritis -Final results of stool GI panel study pending at discharge.  -Patient with complete resolution of symptoms. -Advised to maintain adequate hydration -Continue the use of Florastor.  4-prior history of Kidney stones -Denies hematuria or flank pain. -Advised to maintain adequate hydration.  5-history of BPH (benign prostatic hyperplasia) -Patient denies symptoms of urinary retention. -No medications prior to admission for BPH. -Continue monitoring and if needed initiate treatment with Flomax.  6-AKI -unclear if acute; vs acute superimposed on CKD.  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 -No records or previous labs for comparison. -Significant improvement after fluid resuscitation. -At discharge creatinine 1.43 -continue holding nephrotoxic agents -Repeat basic metabolic panel follow-up visit to reassess renal function trend. -Patient advised to maintain adequate hydration.      Procedures:  See below for x-ray reports.  Consultations:  None  Discharge Exam: Vitals:   12/18/18 0545 12/18/18 0728  BP: (!) 164/89   Pulse: (!) 57   Resp: 17   Temp: (!) 97.5 F (36.4 C)   SpO2: 99% 98%  General:  Afebrile, no chest pain, no further episode of nausea/vomiting or diarrhea.  Tolerating diet appropriately and denying dizziness. Cardiovascular: S1-S2, no rubs, no gallops, no murmurs. Respiratory: Clear to auscultation bilaterally, normal respiratory effort. Abdomen: Soft, nontender, nondistended, positive bowel sounds Extremities: No edema, no cyanosis, no.  Discharge Instructions   Discharge Instructions    Diet - low sodium heart healthy   Complete by:  As directed    Discharge instructions   Complete by:  As directed    Keep yourself well-hydrated Take medications as prescribed Follow heart healthy diet (watching the amount of sodium intake) Maintain adequate hydration Arrange follow-up/establish care with PCP in 2 weeks     Allergies as of 12/18/2018   No Known Allergies     Medication List    STOP taking these medications   chlorthalidone 50 MG tablet Commonly known as:  HYGROTON   lisinopril 20 MG tablet Commonly known as:  PRINIVIL,ZESTRIL     TAKE these medications   amitriptyline 100 MG tablet Commonly known as:  ELAVIL Take 100 mg by mouth at bedtime.   amLODipine 10 MG tablet Commonly known as:  NORVASC Take 1 tablet (10 mg total) by mouth daily.   diazepam 10 MG tablet Commonly known as:  VALIUM Take 10 mg by mouth every 6 (six) hours as needed for anxiety.   esomeprazole 20 MG capsule Commonly known as:  NEXIUM Take 20 mg by mouth daily at 12 noon.   meclizine 25 MG tablet Commonly known as:  ANTIVERT Take 1 tablet (25 mg total) by mouth 2 (two) times daily as needed for dizziness.   saccharomyces boulardii 250 MG capsule Commonly known as:  FLORASTOR Take 1 capsule (250 mg total) by mouth 2 (two) times daily.      No Known Allergies  The results of significant diagnostics from this hospitalization (including imaging, microbiology, ancillary and laboratory) are listed below for reference.    Labs: Basic Metabolic Panel: Recent Labs   Lab 12/16/18 1130 12/17/18 0429 12/18/18 0445  NA 138 140 142  K 4.5 4.6 3.9  CL 100 105 108  CO2 27 27 27   GLUCOSE 112* 109* 74  BUN 27* 26* 24*  CREATININE 1.68* 1.55* 1.43*  CALCIUM 10.2 9.0 8.4*   Liver Function Tests: Recent Labs  Lab 12/16/18 1130  AST 14*  ALT 12  ALKPHOS 119  BILITOT 0.6  PROT 8.2*  ALBUMIN 4.3   CBC: Recent Labs  Lab 12/16/18 1130  WBC 7.8  NEUTROABS 5.3  HGB 17.0  HCT 51.5  MCV 90.4  PLT 246    Signed:  Vassie Loll MD.  [facility] 12/18/2018, 8:56 AM

## 2018-12-22 LAB — NOROVIRUS GROUP 1 & 2 BY PCR, STOOL
Norovirus 1 by PCR: NEGATIVE
Norovirus 2  by PCR: NEGATIVE

## 2019-04-29 ENCOUNTER — Encounter (HOSPITAL_COMMUNITY): Payer: Self-pay

## 2019-04-29 ENCOUNTER — Emergency Department (HOSPITAL_COMMUNITY): Payer: Medicaid - Out of State

## 2019-04-29 ENCOUNTER — Emergency Department (HOSPITAL_COMMUNITY)
Admission: EM | Admit: 2019-04-29 | Discharge: 2019-04-29 | Disposition: A | Discharge status: Home / Self Care | Payer: Medicaid - Out of State | Attending: Emergency Medicine | Admitting: Emergency Medicine

## 2019-04-29 DIAGNOSIS — Z87891 Personal history of nicotine dependence: Secondary | ICD-10-CM | POA: Insufficient documentation

## 2019-04-29 DIAGNOSIS — R001 Bradycardia, unspecified: Secondary | ICD-10-CM

## 2019-04-29 DIAGNOSIS — R296 Repeated falls: Secondary | ICD-10-CM | POA: Diagnosis present

## 2019-04-29 DIAGNOSIS — I10 Essential (primary) hypertension: Secondary | ICD-10-CM | POA: Insufficient documentation

## 2019-04-29 DIAGNOSIS — M79604 Pain in right leg: Secondary | ICD-10-CM | POA: Insufficient documentation

## 2019-04-29 DIAGNOSIS — G8929 Other chronic pain: Secondary | ICD-10-CM | POA: Insufficient documentation

## 2019-04-29 DIAGNOSIS — R7989 Other specified abnormal findings of blood chemistry: Secondary | ICD-10-CM

## 2019-04-29 DIAGNOSIS — M79671 Pain in right foot: Secondary | ICD-10-CM | POA: Diagnosis not present

## 2019-04-29 HISTORY — DX: Unspecified convulsions: R56.9

## 2019-04-29 HISTORY — DX: Unspecified intracranial injury with loss of consciousness of unspecified duration, initial encounter: S06.9X9A

## 2019-04-29 HISTORY — DX: Unspecified intracranial injury with loss of consciousness status unknown, initial encounter: S06.9XAA

## 2019-04-29 LAB — CBC WITH DIFFERENTIAL/PLATELET
Abs Immature Granulocytes: 0.01 10*3/uL (ref 0.00–0.07)
Basophils Absolute: 0 10*3/uL (ref 0.0–0.1)
Basophils Relative: 1 %
Eosinophils Absolute: 0.2 10*3/uL (ref 0.0–0.5)
Eosinophils Relative: 5 %
HCT: 34.8 % — ABNORMAL LOW (ref 39.0–52.0)
Hemoglobin: 11.1 g/dL — ABNORMAL LOW (ref 13.0–17.0)
Immature Granulocytes: 0 %
Lymphocytes Relative: 21 %
Lymphs Abs: 1 10*3/uL (ref 0.7–4.0)
MCH: 29.8 pg (ref 26.0–34.0)
MCHC: 31.9 g/dL (ref 30.0–36.0)
MCV: 93.3 fL (ref 80.0–100.0)
Monocytes Absolute: 0.3 10*3/uL (ref 0.1–1.0)
Monocytes Relative: 6 %
Neutro Abs: 3.3 10*3/uL (ref 1.7–7.7)
Neutrophils Relative %: 67 %
Platelets: 170 10*3/uL (ref 150–400)
RBC: 3.73 MIL/uL — ABNORMAL LOW (ref 4.22–5.81)
RDW: 13.1 % (ref 11.5–15.5)
WBC: 4.8 10*3/uL (ref 4.0–10.5)
nRBC: 0 % (ref 0.0–0.2)

## 2019-04-29 LAB — COMPREHENSIVE METABOLIC PANEL
ALT: 17 U/L (ref 0–44)
AST: 22 U/L (ref 15–41)
Albumin: 3.1 g/dL — ABNORMAL LOW (ref 3.5–5.0)
Alkaline Phosphatase: 69 U/L (ref 38–126)
Anion gap: 6 (ref 5–15)
BUN: 40 mg/dL — ABNORMAL HIGH (ref 6–20)
CO2: 25 mmol/L (ref 22–32)
Calcium: 8 mg/dL — ABNORMAL LOW (ref 8.9–10.3)
Chloride: 108 mmol/L (ref 98–111)
Creatinine, Ser: 2.43 mg/dL — ABNORMAL HIGH (ref 0.61–1.24)
GFR calc Af Amer: 32 mL/min — ABNORMAL LOW (ref >60)
GFR calc non Af Amer: 28 mL/min — ABNORMAL LOW (ref >60)
Glucose, Bld: 97 mg/dL (ref 70–99)
Potassium: 4.3 mmol/L (ref 3.5–5.1)
Sodium: 139 mmol/L (ref 135–145)
Total Bilirubin: 0.1 mg/dL — ABNORMAL LOW (ref 0.3–1.2)
Total Protein: 5.9 g/dL — ABNORMAL LOW (ref 6.5–8.1)

## 2019-04-29 LAB — TROPONIN I (HIGH SENSITIVITY)
Troponin I (High Sensitivity): 6 ng/L (ref <18)
Troponin I (High Sensitivity): 6 ng/L (ref <18)

## 2019-04-29 LAB — URINALYSIS, ROUTINE W REFLEX MICROSCOPIC
Bilirubin Urine: NEGATIVE
Glucose, UA: NEGATIVE mg/dL
Hgb urine dipstick: NEGATIVE
Ketones, ur: NEGATIVE mg/dL
Leukocytes,Ua: NEGATIVE
Nitrite: NEGATIVE
Protein, ur: NEGATIVE mg/dL
Specific Gravity, Urine: 1.015 (ref 1.005–1.030)
pH: 5 (ref 5.0–8.0)

## 2019-04-29 LAB — RAPID URINE DRUG SCREEN, HOSP PERFORMED
Amphetamines: NOT DETECTED
Barbiturates: NOT DETECTED
Benzodiazepines: POSITIVE — AB
Cocaine: POSITIVE — AB
Opiates: NOT DETECTED
Tetrahydrocannabinol: NOT DETECTED

## 2019-04-29 LAB — LIPASE, BLOOD: Lipase: 20 U/L (ref 11–51)

## 2019-04-29 LAB — ETHANOL: Alcohol, Ethyl (B): <10 mg/dL (ref <10)

## 2019-04-29 MED ORDER — KETOROLAC TROMETHAMINE 30 MG/ML IJ SOLN
30.0000 mg | Freq: Once | INTRAMUSCULAR | Status: AC
  Administered 2019-04-29: 13:00:00 30 mg via INTRAVENOUS
  Filled 2019-04-29: qty 1

## 2019-04-29 MED ORDER — SODIUM CHLORIDE 0.9 % IV BOLUS
500.0000 mL | Freq: Once | INTRAVENOUS | Status: AC
  Administered 2019-04-29: 500 mL via INTRAVENOUS

## 2019-04-29 MED ORDER — KETOROLAC TROMETHAMINE 30 MG/ML IJ SOLN
15.0000 mg | Freq: Once | INTRAMUSCULAR | Status: AC
  Administered 2019-04-29: 15 mg via INTRAVENOUS
  Filled 2019-04-29: qty 1

## 2019-04-29 NOTE — ED Notes (Signed)
Patient stated that upon sitting up he felt very dizzy and "I feel like I am tilting forward".  We did not do ortho v/s standing due to patient being dizzy.

## 2019-04-29 NOTE — ED Notes (Addendum)
Pt"s HR in 50's and 60's when he isn't asleep. p ox 96-98% when he is awake. Dr Lacinda Axon aware

## 2019-04-29 NOTE — TOC Transition Note (Signed)
Transition of Care [facility]) - CM/SW Discharge Note   Patient Details  Name: Javier Rose MRN: [id number] Date of Birth: 10/05/1960  Transition of [facility]) CM/SW Contact:  Trish Mage, LCSW Phone Number: 04/29/2019, 3:35 PM   Clinical Narrative:   Unable to wake patient to talk to him about [facility] referral.  Called sister listed in chart with whom he lives.  She states he has not had services in the past, has no DME, and she doubts he would use a walker, although she worries about his falls.  He has been residing with her for about 5 months, but has not to this point gotten [facility]. I called two local [place] agencies, both of whom gave me the same message; [facility] not [facility] for services in [place].  His sister states this is the impetus to get that done, and promised that she will make the call and put him on the phone as a way of structuring this to happen.  I left the listing of [place] agencies with nurse tech Vickie to give to patient when he awakens, along with instructions to call for help when he has switched MCD.    Final next level of care: Home/Self Care     Patient Goals and CMS Choice   CMS Medicare.gov Compare Post Acute Care list provided to:: Patient Choice offered to / list presented to : Patient  Discharge Placement                       Discharge Plan and Services                                     Social Determinants of Health (SDOH) Interventions     Readmission Risk Interventions No flowsheet data found.

## 2019-04-29 NOTE — ED Notes (Signed)
Spoke with pt's mother and updated her on status.

## 2019-04-29 NOTE — ED Triage Notes (Addendum)
Pt reports for the past 4 days he hasn't been able to stand up.  Asked pt if he felt weak and he said no, "my legs just won't get under me."  Pt took bp medication this morning and passed out when he stood up.  Pt c/o bilateral ankle pain.  Pt presently alert and oriented.    EMS reports pt's bp was 96 systolic.  EMS gave approx 764ml ns IV.

## 2019-04-29 NOTE — Discharge Instructions (Addendum)
Your pulse is low (in the 50s).  Additionally your creatinine which is a measure of kidney function is elevated (2.43).  You will need primary care follow-up.  Call local clinic in [place].  Phone number given.
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
## 2019-04-29 NOTE — ED Notes (Signed)
Call to update pt's sister, and notify of discharge. No answer, message left

## 2019-04-29 NOTE — ED Notes (Signed)
ED Provider at bedside. 

## 2019-04-29 NOTE — ED Provider Notes (Signed)
Patient rechecked prior to discharge.  Pulse increases in the 50s while he is awake.  No acute distress.   Nat Christen, MD 04/29/19 [phone]

## 2019-04-29 NOTE — ED Notes (Signed)
Repeat EKG printed and delivered to Dr Laverta Baltimore. HR 39-41, b/p WNL. HR in 50's when pt alert and talking, pt is sleeping now.

## 2019-05-09 ENCOUNTER — Emergency Department (HOSPITAL_COMMUNITY): Payer: No Typology Code available for payment source | Admitting: Anesthesiology

## 2019-05-09 ENCOUNTER — Emergency Department (HOSPITAL_COMMUNITY): Payer: No Typology Code available for payment source

## 2019-05-09 ENCOUNTER — Encounter (HOSPITAL_COMMUNITY)
Admission: EM | Disposition: A | Discharge status: Home / Self Care | Payer: Self-pay | Source: Home / Self Care | Attending: Orthopedic Surgery

## 2019-05-09 ENCOUNTER — Encounter (HOSPITAL_COMMUNITY): Payer: Self-pay

## 2019-05-09 ENCOUNTER — Other Ambulatory Visit: Payer: Self-pay

## 2019-05-09 ENCOUNTER — Inpatient Hospital Stay (HOSPITAL_COMMUNITY)
Admission: EM | Admit: 2019-05-09 | Discharge: 2019-05-10 | DRG: 906 | Disposition: A | Discharge status: Home / Self Care | Payer: No Typology Code available for payment source | Attending: Orthopedic Surgery | Admitting: Orthopedic Surgery

## 2019-05-09 DIAGNOSIS — Z79899 Other long term (current) drug therapy: Secondary | ICD-10-CM | POA: Diagnosis not present

## 2019-05-09 DIAGNOSIS — G40909 Epilepsy, unspecified, not intractable, without status epilepticus: Secondary | ICD-10-CM | POA: Diagnosis present

## 2019-05-09 DIAGNOSIS — Z87891 Personal history of nicotine dependence: Secondary | ICD-10-CM

## 2019-05-09 DIAGNOSIS — S62632B Displaced fracture of distal phalanx of right middle finger, initial encounter for open fracture: Secondary | ICD-10-CM | POA: Diagnosis present

## 2019-05-09 DIAGNOSIS — Z87820 Personal history of traumatic brain injury: Secondary | ICD-10-CM | POA: Diagnosis not present

## 2019-05-09 DIAGNOSIS — K219 Gastro-esophageal reflux disease without esophagitis: Secondary | ICD-10-CM | POA: Diagnosis present

## 2019-05-09 DIAGNOSIS — Y9389 Activity, other specified: Secondary | ICD-10-CM | POA: Diagnosis not present

## 2019-05-09 DIAGNOSIS — D649 Anemia, unspecified: Secondary | ICD-10-CM | POA: Diagnosis present

## 2019-05-09 DIAGNOSIS — S6721XA Crushing injury of right hand, initial encounter: Secondary | ICD-10-CM | POA: Diagnosis present

## 2019-05-09 DIAGNOSIS — Z20828 Contact with and (suspected) exposure to other viral communicable diseases: Secondary | ICD-10-CM | POA: Diagnosis present

## 2019-05-09 DIAGNOSIS — W230XXA Caught, crushed, jammed, or pinched between moving objects, initial encounter: Secondary | ICD-10-CM | POA: Diagnosis present

## 2019-05-09 DIAGNOSIS — S61314A Laceration without foreign body of right ring finger with damage to nail, initial encounter: Secondary | ICD-10-CM

## 2019-05-09 DIAGNOSIS — I10 Essential (primary) hypertension: Secondary | ICD-10-CM | POA: Diagnosis present

## 2019-05-09 DIAGNOSIS — I454 Nonspecific intraventricular block: Secondary | ICD-10-CM | POA: Diagnosis present

## 2019-05-09 DIAGNOSIS — S6720XA Crushing injury of unspecified hand, initial encounter: Secondary | ICD-10-CM | POA: Diagnosis present

## 2019-05-09 DIAGNOSIS — S62634B Displaced fracture of distal phalanx of right ring finger, initial encounter for open fracture: Secondary | ICD-10-CM | POA: Diagnosis present

## 2019-05-09 DIAGNOSIS — R944 Abnormal results of kidney function studies: Secondary | ICD-10-CM | POA: Diagnosis present

## 2019-05-09 DIAGNOSIS — S62622B Displaced fracture of medial phalanx of right middle finger, initial encounter for open fracture: Secondary | ICD-10-CM

## 2019-05-09 DIAGNOSIS — R7989 Other specified abnormal findings of blood chemistry: Secondary | ICD-10-CM

## 2019-05-09 HISTORY — PX: NERVE, TENDON AND ARTERY REPAIR: SHX5695

## 2019-05-09 LAB — BASIC METABOLIC PANEL
Anion gap: 8 (ref 5–15)
BUN: 39 mg/dL — ABNORMAL HIGH (ref 6–20)
CO2: 18 mmol/L — ABNORMAL LOW (ref 22–32)
Calcium: 7.9 mg/dL — ABNORMAL LOW (ref 8.9–10.3)
Chloride: 115 mmol/L — ABNORMAL HIGH (ref 98–111)
Creatinine, Ser: 2.78 mg/dL — ABNORMAL HIGH (ref 0.61–1.24)
GFR calc Af Amer: 28 mL/min — ABNORMAL LOW (ref >60)
GFR calc non Af Amer: 24 mL/min — ABNORMAL LOW (ref >60)
Glucose, Bld: 82 mg/dL (ref 70–99)
Potassium: 4.9 mmol/L (ref 3.5–5.1)
Sodium: 141 mmol/L (ref 135–145)

## 2019-05-09 LAB — CBC
HCT: 32.1 % — ABNORMAL LOW (ref 39.0–52.0)
Hemoglobin: 10.4 g/dL — ABNORMAL LOW (ref 13.0–17.0)
MCH: 28.7 pg (ref 26.0–34.0)
MCHC: 32.4 g/dL (ref 30.0–36.0)
MCV: 88.4 fL (ref 80.0–100.0)
Platelets: 233 10*3/uL (ref 150–400)
RBC: 3.63 MIL/uL — ABNORMAL LOW (ref 4.22–5.81)
RDW: 12.8 % (ref 11.5–15.5)
WBC: 5.2 10*3/uL (ref 4.0–10.5)
nRBC: 0 % (ref 0.0–0.2)

## 2019-05-09 LAB — SARS CORONAVIRUS 2 BY RT PCR (HOSPITAL ORDER, PERFORMED IN ~~LOC~~ HOSPITAL LAB): SARS Coronavirus 2: NEGATIVE

## 2019-05-09 LAB — GLUCOSE, CAPILLARY: Glucose-Capillary: 100 mg/dL — ABNORMAL HIGH (ref 70–99)

## 2019-05-09 SURGERY — NERVE, TENDON AND ARTERY REPAIR
Anesthesia: General | Laterality: Right

## 2019-05-09 MED ORDER — FENTANYL CITRATE (PF) 250 MCG/5ML IJ SOLN
INTRAMUSCULAR | Status: AC
  Filled 2019-05-09: qty 5

## 2019-05-09 MED ORDER — EPHEDRINE 5 MG/ML INJ
INTRAVENOUS | Status: AC
  Filled 2019-05-09: qty 10

## 2019-05-09 MED ORDER — ACETAMINOPHEN 500 MG PO TABS
500.0000 mg | ORAL_TABLET | Freq: Four times a day (QID) | ORAL | Status: DC
  Administered 2019-05-10: 500 mg via ORAL
  Filled 2019-05-09: qty 1

## 2019-05-09 MED ORDER — CEFAZOLIN SODIUM-DEXTROSE 2-4 GM/100ML-% IV SOLN
2.0000 g | Freq: Two times a day (BID) | INTRAVENOUS | Status: DC
  Administered 2019-05-09 (×2): 2 g via INTRAVENOUS
  Filled 2019-05-09 (×2): qty 100

## 2019-05-09 MED ORDER — LIDOCAINE 2% (20 MG/ML) 5 ML SYRINGE
INTRAMUSCULAR | Status: AC
  Filled 2019-05-09: qty 5

## 2019-05-09 MED ORDER — ALPRAZOLAM 0.5 MG PO TABS
0.5000 mg | ORAL_TABLET | Freq: Four times a day (QID) | ORAL | Status: DC | PRN
  Administered 2019-05-10: 0.5 mg via ORAL
  Filled 2019-05-09: qty 1

## 2019-05-09 MED ORDER — SUCCINYLCHOLINE CHLORIDE 20 MG/ML IJ SOLN
INTRAMUSCULAR | Status: DC | PRN
  Administered 2019-05-09: 100 mg via INTRAVENOUS

## 2019-05-09 MED ORDER — GLYCOPYRROLATE 0.2 MG/ML IJ SOLN
INTRAMUSCULAR | Status: DC | PRN
  Administered 2019-05-09 (×2): .2 mg via INTRAVENOUS

## 2019-05-09 MED ORDER — MIDAZOLAM HCL 5 MG/5ML IJ SOLN
INTRAMUSCULAR | Status: DC | PRN
  Administered 2019-05-09: 1 mg via INTRAVENOUS

## 2019-05-09 MED ORDER — HYDROCODONE-ACETAMINOPHEN 5-325 MG PO TABS: 1.0000 | ORAL_TABLET | ORAL | Status: DC | PRN

## 2019-05-09 MED ORDER — DEXAMETHASONE SODIUM PHOSPHATE 10 MG/ML IJ SOLN
INTRAMUSCULAR | Status: AC
  Filled 2019-05-09: qty 3

## 2019-05-09 MED ORDER — ACETAMINOPHEN 325 MG PO TABS: 325.0000 mg | ORAL_TABLET | Freq: Four times a day (QID) | ORAL | Status: DC | PRN

## 2019-05-09 MED ORDER — ONDANSETRON HCL 4 MG/2ML IJ SOLN: 4.0000 mg | Freq: Four times a day (QID) | INTRAMUSCULAR | Status: DC | PRN

## 2019-05-09 MED ORDER — ROCURONIUM BROMIDE 50 MG/5ML IV SOSY
PREFILLED_SYRINGE | INTRAVENOUS | Status: DC | PRN
  Administered 2019-05-09: 10 mg via INTRAVENOUS

## 2019-05-09 MED ORDER — EPHEDRINE SULFATE 50 MG/ML IJ SOLN
INTRAMUSCULAR | Status: DC | PRN
  Administered 2019-05-09 (×5): 10 mg via INTRAVENOUS

## 2019-05-09 MED ORDER — LACTATED RINGERS IV SOLN
INTRAVENOUS | Status: DC
  Administered 2019-05-09: 20:00:00 via INTRAVENOUS

## 2019-05-09 MED ORDER — PROMETHAZINE HCL 25 MG/ML IJ SOLN: 6.2500 mg | INTRAMUSCULAR | Status: DC | PRN

## 2019-05-09 MED ORDER — SODIUM CHLORIDE 0.9 % IV SOLN
INTRAVENOUS | Status: DC
  Administered 2019-05-09 (×2): via INTRAVENOUS

## 2019-05-09 MED ORDER — ONDANSETRON HCL 4 MG PO TABS: 4.0000 mg | ORAL_TABLET | Freq: Four times a day (QID) | ORAL | Status: DC | PRN

## 2019-05-09 MED ORDER — ALBUMIN HUMAN 5 % IV SOLN
INTRAVENOUS | Status: DC | PRN
  Administered 2019-05-09: 18:00:00 via INTRAVENOUS

## 2019-05-09 MED ORDER — PHENYLEPHRINE 40 MCG/ML (10ML) SYRINGE FOR IV PUSH (FOR BLOOD PRESSURE SUPPORT)
PREFILLED_SYRINGE | INTRAVENOUS | Status: AC
  Filled 2019-05-09: qty 20

## 2019-05-09 MED ORDER — CEFAZOLIN SODIUM-DEXTROSE 1-4 GM/50ML-% IV SOLN
1.0000 g | INTRAVENOUS | Status: AC
  Administered 2019-05-09: 1 g via INTRAVENOUS
  Filled 2019-05-09: qty 50

## 2019-05-09 MED ORDER — VASOPRESSIN 20 UNIT/ML IV SOLN
INTRAVENOUS | Status: AC
  Filled 2019-05-09: qty 1

## 2019-05-09 MED ORDER — CEFAZOLIN SODIUM-DEXTROSE 1-4 GM/50ML-% IV SOLN
1.0000 g | Freq: Three times a day (TID) | INTRAVENOUS | Status: DC
  Administered 2019-05-10: 03:00:00 1 g via INTRAVENOUS
  Filled 2019-05-09 (×3): qty 50

## 2019-05-09 MED ORDER — OXYCODONE HCL 5 MG PO TABS: 5.0000 mg | ORAL_TABLET | Freq: Once | ORAL | Status: DC | PRN

## 2019-05-09 MED ORDER — LACTATED RINGERS IV SOLN: INTRAVENOUS | Status: DC | PRN

## 2019-05-09 MED ORDER — DOCUSATE SODIUM 100 MG PO CAPS
100.0000 mg | ORAL_CAPSULE | Freq: Two times a day (BID) | ORAL | Status: DC
  Administered 2019-05-09 – 2019-05-10 (×2): 100 mg via ORAL
  Filled 2019-05-09 (×2): qty 1

## 2019-05-09 MED ORDER — DEXAMETHASONE SODIUM PHOSPHATE 10 MG/ML IJ SOLN
INTRAMUSCULAR | Status: DC | PRN
  Administered 2019-05-09: 5 mg via INTRAVENOUS

## 2019-05-09 MED ORDER — LIDOCAINE 2% (20 MG/ML) 5 ML SYRINGE
INTRAMUSCULAR | Status: DC | PRN
  Administered 2019-05-09: 60 mg via INTRAVENOUS

## 2019-05-09 MED ORDER — FENTANYL CITRATE (PF) 100 MCG/2ML IJ SOLN
50.0000 ug | Freq: Once | INTRAMUSCULAR | Status: AC
  Administered 2019-05-09: 50 ug via INTRAVENOUS
  Filled 2019-05-09: qty 2

## 2019-05-09 MED ORDER — MIDAZOLAM HCL 2 MG/2ML IJ SOLN
INTRAMUSCULAR | Status: AC
  Filled 2019-05-09: qty 2

## 2019-05-09 MED ORDER — BUPIVACAINE HCL (PF) 0.25 % IJ SOLN
INTRAMUSCULAR | Status: AC
  Filled 2019-05-09: qty 30

## 2019-05-09 MED ORDER — FENTANYL CITRATE (PF) 100 MCG/2ML IJ SOLN
INTRAMUSCULAR | Status: DC | PRN
  Administered 2019-05-09: 100 ug via INTRAVENOUS

## 2019-05-09 MED ORDER — OXYCODONE HCL 5 MG/5ML PO SOLN: 5.0000 mg | Freq: Once | ORAL | Status: DC | PRN

## 2019-05-09 MED ORDER — SODIUM CHLORIDE 0.9 % IR SOLN
Status: DC | PRN
  Administered 2019-05-09 (×2): 3000 mL

## 2019-05-09 MED ORDER — HYDROCODONE-ACETAMINOPHEN 7.5-325 MG PO TABS
1.0000 | ORAL_TABLET | ORAL | Status: DC | PRN
  Administered 2019-05-09 – 2019-05-10 (×2): 2 via ORAL
  Filled 2019-05-09 (×2): qty 2

## 2019-05-09 MED ORDER — HYDROMORPHONE HCL 1 MG/ML IJ SOLN: 0.2500 mg | INTRAMUSCULAR | Status: DC | PRN

## 2019-05-09 MED ORDER — VASOPRESSIN 20 UNIT/ML IV SOLN
INTRAVENOUS | Status: DC | PRN
  Administered 2019-05-09 (×2): 1 [IU] via INTRAVENOUS
  Administered 2019-05-09: 2 [IU] via INTRAVENOUS
  Administered 2019-05-09 (×5): 1 [IU] via INTRAVENOUS

## 2019-05-09 MED ORDER — PROPOFOL 10 MG/ML IV BOLUS
INTRAVENOUS | Status: DC | PRN
  Administered 2019-05-09: 200 mg via INTRAVENOUS

## 2019-05-09 MED ORDER — MORPHINE SULFATE (PF) 2 MG/ML IV SOLN
0.5000 mg | INTRAVENOUS | Status: DC | PRN
  Administered 2019-05-10 (×2): 1 mg via INTRAVENOUS
  Filled 2019-05-09 (×2): qty 1

## 2019-05-09 MED ORDER — BUPIVACAINE HCL (PF) 0.25 % IJ SOLN
INTRAMUSCULAR | Status: DC | PRN
  Administered 2019-05-09: 9 mL

## 2019-05-09 MED ORDER — SODIUM CHLORIDE 0.9 % IV BOLUS
1000.0000 mL | Freq: Once | INTRAVENOUS | Status: AC
  Administered 2019-05-09: 1000 mL via INTRAVENOUS

## 2019-05-09 MED ORDER — ONDANSETRON HCL 4 MG/2ML IJ SOLN
INTRAMUSCULAR | Status: AC
  Filled 2019-05-09: qty 4

## 2019-05-09 MED ORDER — ONDANSETRON HCL 4 MG/2ML IJ SOLN
INTRAMUSCULAR | Status: DC | PRN
  Administered 2019-05-09: 4 mg via INTRAVENOUS

## 2019-05-09 SURGICAL SUPPLY — 45 items
BNDG CONFORM 2 STRL LF (GAUZE/BANDAGES/DRESSINGS) ×3 IMPLANT
BNDG CONFORM 3 STRL LF (GAUZE/BANDAGES/DRESSINGS) ×3 IMPLANT
BNDG ELASTIC 3X5.8 VLCR STR LF (GAUZE/BANDAGES/DRESSINGS) ×3 IMPLANT
BNDG ELASTIC 4X5.8 VLCR STR LF (GAUZE/BANDAGES/DRESSINGS) ×3 IMPLANT
BNDG GAUZE ELAST 4 BULKY (GAUZE/BANDAGES/DRESSINGS) ×3 IMPLANT
CORD BIPOLAR FORCEPS 12FT (ELECTRODE) ×3 IMPLANT
COVER SURGICAL LIGHT HANDLE (MISCELLANEOUS) ×3 IMPLANT
CUFF TOURN SGL QUICK 18X4 (TOURNIQUET CUFF) ×3 IMPLANT
DECANTER SPIKE VIAL GLASS SM (MISCELLANEOUS) ×3 IMPLANT
DRAPE SURG 17X23 STRL (DRAPES) ×3 IMPLANT
DRSG ADAPTIC 3X8 NADH LF (GAUZE/BANDAGES/DRESSINGS) ×3 IMPLANT
DRSG MEPITEL 4X7.2 (GAUZE/BANDAGES/DRESSINGS) ×3 IMPLANT
GAUZE SPONGE 4X4 12PLY STRL (GAUZE/BANDAGES/DRESSINGS) ×3 IMPLANT
GAUZE XEROFORM 5X9 LF (GAUZE/BANDAGES/DRESSINGS) ×3 IMPLANT
GLOVE SS BIOGEL STRL SZ 8 (GLOVE) ×1 IMPLANT
GLOVE SUPERSENSE BIOGEL SZ 8 (GLOVE) ×2
GOWN STRL REUS W/ TWL LRG LVL3 (GOWN DISPOSABLE) ×2 IMPLANT
GOWN STRL REUS W/ TWL XL LVL3 (GOWN DISPOSABLE) ×1 IMPLANT
GOWN STRL REUS W/TWL LRG LVL3 (GOWN DISPOSABLE) ×4
GOWN STRL REUS W/TWL XL LVL3 (GOWN DISPOSABLE) ×2
JET LAVAGE IRRISEPT WOUND (IRRIGATION / IRRIGATOR) ×3
K-WIRE DBL TROCAR .045X4 ×3 IMPLANT
KIT BASIN OR (CUSTOM PROCEDURE TRAY) ×3 IMPLANT
KIT TURNOVER KIT B (KITS) ×3 IMPLANT
KWIRE DBL TROCAR .045X4 ×1 IMPLANT
LAVAGE JET IRRISEPT WOUND (IRRIGATION / IRRIGATOR) ×1 IMPLANT
MANIFOLD NEPTUNE II (INSTRUMENTS) ×3 IMPLANT
NEEDLE HYPO 25GX1X1/2 BEV (NEEDLE) ×3 IMPLANT
NS IRRIG 1000ML POUR BTL (IV SOLUTION) ×3 IMPLANT
PACK ORTHO EXTREMITY (CUSTOM PROCEDURE TRAY) ×3 IMPLANT
PAD ARMBOARD 7.5X6 YLW CONV (MISCELLANEOUS) ×6 IMPLANT
SET CYSTO W/LG BORE CLAMP LF (SET/KITS/TRAYS/PACK) ×3 IMPLANT
SOL PREP POV-IOD 4OZ 10% (MISCELLANEOUS) ×6 IMPLANT
SPECIMEN JAR SMALL (MISCELLANEOUS) ×3 IMPLANT
SPLINT FIBERGLASS 3X12 (CAST SUPPLIES) ×3 IMPLANT
SPLINT FINGER 7.25 W/BULB ALUM (SOFTGOODS) ×6 IMPLANT
SUT CHROMIC 4 0 P 3 18 (SUTURE) ×3 IMPLANT
SUT CHROMIC 5 0 P 3 (SUTURE) ×3 IMPLANT
SYR CONTROL 10ML LL (SYRINGE) ×3 IMPLANT
TOWEL GREEN STERILE (TOWEL DISPOSABLE) ×3 IMPLANT
TOWEL GREEN STERILE FF (TOWEL DISPOSABLE) ×3 IMPLANT
TUBE CONNECTING 12'X1/4 (SUCTIONS) ×1
TUBE CONNECTING 12X1/4 (SUCTIONS) ×2 IMPLANT
UNDERPAD 30X30 (UNDERPADS AND DIAPERS) ×3 IMPLANT
WATER STERILE IRR 1000ML POUR (IV SOLUTION) ×3 IMPLANT

## 2019-05-09 NOTE — Anesthesia Postprocedure Evaluation (Signed)
Anesthesia Post Note  Patient: Javier Rose  Procedure(s) Performed: RIGHT HAND RECONSTRUCTION/MIDDLE AND RING FINGER (Right )     Patient location during evaluation: PACU Anesthesia Type: General Level of consciousness: awake Pain management: pain level controlled Vital Signs Assessment: post-procedure vital signs reviewed and stable Respiratory status: spontaneous breathing, nonlabored ventilation, respiratory function stable and patient connected to nasal cannula oxygen Cardiovascular status: blood pressure returned to baseline and stable Postop Assessment: no apparent nausea or vomiting Anesthetic complications: no    Last Vitals:  Vitals:   05/09/19 2025 05/09/19 2109  BP: (!) 105/54 (!) 112/49  Pulse: (!) 56 (!) 52  Resp: (!) 27 11  Temp:  36.4 C  SpO2: 100% 95%    Last Pain:  Vitals:   05/09/19 2109  TempSrc: Oral  PainSc:                  Karyl Kinnier Ellender

## 2019-05-09 NOTE — ED Notes (Signed)
Dr. Amedeo Plenty at bedside. Explained surgical procedure and consent signed. MD applied saline and bulky dressing to hand

## 2019-05-09 NOTE — Anesthesia Procedure Notes (Signed)
Procedure Name: Intubation Date/Time: 05/09/2019 5:44 PM Performed by: Suzy Bouchard, CRNA Pre-anesthesia Checklist: Patient identified, Emergency Drugs available, Suction available, Patient being monitored and Timeout performed Patient Re-evaluated:Patient Re-evaluated prior to induction Oxygen Delivery Method: Circle system utilized Preoxygenation: Pre-oxygenation with 100% oxygen Induction Type: IV induction and Rapid sequence Laryngoscope Size: Miller and 2 Grade View: Grade I Tube type: Oral Tube size: 7.5 mm Number of attempts: 1 Airway Equipment and Method: Stylet Placement Confirmation: ETT inserted through vocal cords under direct vision,  positive ETCO2 and breath sounds checked- equal and bilateral Secured at: 23 cm Tube secured with: Tape Dental Injury: Teeth and Oropharynx as per pre-operative assessment

## 2019-05-09 NOTE — Progress Notes (Signed)
Pt is awake, but very slow reacted to answer the question. Slurred speeches but understandable.  Following commands moves fingers Rt hand. Asked what is for the bule form for that elevate arm.CRNA reported he has sz disorder but no medicine taking now.

## 2019-05-09 NOTE — Transfer of Care (Signed)
Immediate Anesthesia Transfer of Care Note  Patient: Javier Rose  Procedure(s) Performed: RIGHT HAND RECONSTRUCTION/MIDDLE AND RING FINGER (Right )  Patient Location: PACU  Anesthesia Type:General  Level of Consciousness: drowsy and responds to stimulation  Airway & Oxygen Therapy: Patient Spontanous Breathing and Patient connected to nasal cannula oxygen  Post-op Assessment: Report given to RN, Post -op Vital signs reviewed and stable and Patient moving all extremities X 4  Post vital signs: Reviewed and stable  Last Vitals:  Vitals Value Taken Time  BP 101/74 05/09/19 1916  Temp    Pulse 50 05/09/19 1921  Resp 25 05/09/19 1921  SpO2 100 % 05/09/19 1921  Vitals shown include unvalidated device data.  Last Pain:  Vitals:   05/09/19 1510  TempSrc:   PainSc: 4          Complications: No apparent anesthesia complications.  Patient oriented to self and place.  Moves all extremities.  Still very drowsy. Speech sounds normal.

## 2019-05-09 NOTE — ED Provider Notes (Signed)
[facility] EMERGENCY DEPARTMENT Provider Note   CSN: [id number] Arrival date & time: 05/09/19  1211     History   Chief Complaint Chief Complaint  Patient presents with  . Finger Injury    Right middle finger amp    HPI Javier Rose is a 58 y.o. male presenting for evaluation of finger injury.  Patient states was prior to arrival he was doing work under a carriage of a car when it moved and frame of the car and landed on his right hand.  He reports injury of his ring and middle finger on his right side.  He reports severe pain at that area.  He reports numbness distally to the injury.  He denies injury elsewhere.  His last tetanus shot was last year.  He is not on blood thinners.  He is right-handed. History of hypertension for which he takes medication, hepatitis, GERD.  Recently, patient has had issues with hypotension.     HPI  Past Medical History:  Diagnosis Date  . GERD (gastroesophageal reflux disease)   . Hepatitis   . History of kidney stones   . Hypertension   . Seizures ([place])   . TBI (traumatic brain injury) [facility])     Patient Active Problem List   Diagnosis Date Noted  . AKI (acute kidney injury) ([place])   . Dehydration   . Orthostatic hypotension   . Gastroesophageal reflux disease   . Dizziness 12/16/2018  . HTN (hypertension) 12/16/2018  . Kidney stones 12/16/2018  . BPH (benign prostatic hyperplasia) 12/16/2018    Past Surgical History:  Procedure Laterality Date  . HERNIA REPAIR          Home Medications    Prior to Admission medications   Medication Sig Start Date End Date Taking? Authorizing Provider  amitriptyline (ELAVIL) 100 MG tablet Take 100 mg by mouth at bedtime.   Yes [provider]  amLODipine (NORVASC) 10 MG tablet Take 1 tablet (10 mg total) by mouth daily. 12/18/18 12/18/19 Yes Barton Dubois, MD  diazepam (VALIUM) 10 MG tablet Take 10 mg by mouth every 6 (six) hours as needed for anxiety.   Yes  [provider]  esomeprazole (NEXIUM) 20 MG capsule Take 20 mg by mouth daily at 12 noon.   Yes [provider]  gabapentin (NEURONTIN) 600 MG tablet Take 1,200 mg by mouth at bedtime.   Yes [provider]    Family History History reviewed. No pertinent family history.  Social History Social History   Tobacco Use  . Smoking status: Former Smoker    Packs/day: 1.00    Types: Cigarettes    Quit date: 11/16/2018    Years since quitting: 0.4  . Smokeless tobacco: Never Used  Substance Use Topics  . Alcohol use: Never    Frequency: Never  . Drug use: Not Currently     Allergies   Patient has no known allergies.   Review of Systems Review of Systems  Skin: Positive for wound.  Allergic/Immunologic: Negative for immunocompromised state.  Neurological: Positive for numbness.  Hematological: Does not bruise/bleed easily.  All other systems reviewed and are negative.    Physical Exam Updated Vital Signs BP (!) 123/59   Pulse (!) 52   Temp 97.9 F (36.6 C) (Oral)   Resp 16   Ht 5\' 9"  (1.753 m)   Wt 78.9 kg   SpO2 94%   BMI 25.70 kg/m   Physical Exam Vitals signs and nursing note  reviewed.  Constitutional:      General: He is not in acute distress.    Appearance: He is well-developed.     Comments: Nontoxic  HENT:     Head: Normocephalic and atraumatic.  Eyes:     Conjunctiva/sclera: Conjunctivae normal.     Pupils: Pupils are equal, round, and reactive to light.  Neck:     Musculoskeletal: Normal range of motion and neck supple.  Cardiovascular:     Rate and Rhythm: Normal rate and regular rhythm.     Pulses: Normal pulses.  Pulmonary:     Effort: Pulmonary effort is normal. No respiratory distress.     Breath sounds: Normal breath sounds. No wheezing.  Abdominal:     General: There is no distension.     Palpations: Abdomen is soft. There is no mass.     Tenderness: There is no abdominal tenderness. There is no guarding or  rebound.  Musculoskeletal:        General: Deformity and signs of injury present.     Comments: See picture below.  Proximal amputation of the distal right middle finger.  Laceration of the distal right ring finger on the ulnar side with associated subungal hematoma. No active bleeding. Patient reports numbness distally to the middle finger injury.  Finger in flexion at the DIP, unable to extend.  Skin:    General: Skin is warm and dry.     Capillary Refill: Capillary refill takes less than 2 seconds.  Neurological:     Mental Status: He is alert and oriented to person, place, and time.          ED Treatments / Results  Labs (all labs ordered are listed, but only abnormal results are displayed) Labs Reviewed  CBC - Abnormal; Notable for the following components:      Result Value   RBC 3.63 (*)    Hemoglobin 10.4 (*)    HCT 32.1 (*)    All other components within normal limits  BASIC METABOLIC PANEL - Abnormal; Notable for the following components:   Chloride 115 (*)    CO2 18 (*)    BUN 39 (*)    Creatinine, Ser 2.78 (*)    Calcium 7.9 (*)    GFR calc non Af Amer 24 (*)    GFR calc Af Amer 28 (*)    All other components within normal limits  SARS CORONAVIRUS 2 (HOSPITAL ORDER, PERFORMED IN [facility])    EKG None  Radiology Dg Hand Complete Right  Result Date: 05/09/2019 CLINICAL DATA:  Trauma to the third and fourth digits. EXAM: RIGHT HAND - COMPLETE 3+ VIEW COMPARISON:  None. FINDINGS: There is a transverse fracture of the distal third middle phalanx with deep soft tissue injury and question open component of the fracture. There is an oblique fracture of the distal tip of the distal fourth phalanx with associated soft tissue injury extending through the nail. IMPRESSION: 1. Transverse fracture of the distal third middle phalanx with associated marked soft tissue injury and question open component of the fracture. 2. Oblique fracture of the distal tip of  the distal fourth phalanx with associated soft tissue injury extending through the nail. Electronically Signed   By: Ted Mcalpineobrinka  Dimitrova M.D.   On: 05/09/2019 13:34    Procedures Procedures (including critical care time)  Medications Ordered in ED Medications  ceFAZolin (ANCEF) IVPB 2g/100 mL premix (2 g Intravenous New Bag/Given 05/09/19 1411)  sodium chloride 0.9 % bolus  1,000 mL (0 mLs Intravenous Stopped 05/09/19 1340)  fentaNYL (SUBLIMAZE) injection 50 mcg (50 mcg Intravenous Given 05/09/19 1419)  fentaNYL (SUBLIMAZE) 250 MCG/5ML injection (has no administration in time range)     Initial Impression / Assessment and Plan / ED Course  I have reviewed the triage vital signs and the nursing notes.  Pertinent labs & imaging results that were available during my care of the patient were reviewed by me and considered in my medical decision making (see chart for details).          Patient presenting for evaluation after finger injury.  On exam, patient with obvious formerly of the distal right finger.  Unable to extend finger.  Ring finger also with laceration and subungual hematoma.  Will obtain x-ray, however patient will likely need to go to the OR for repair.  Basic labs ordered.  COVID ordered.  Antibiotics ordered.  Patient states he is up-to-date on his tetanus. Of note, patient's blood pressure was low on arrival.  It was normal with EMS.  This is likely secondary to the 6 mg of morphine.  Fluid bolus ordered, will hold on further pain medicine at this time.  Patient's blood pressure improving, map of 75 and 105 systolic.  50 fentanyl given for pain, will keep a close eye on blood pressure.  Discussed with Dr. Amanda Pea from hand surgery who evaluated the pt and will take patient to the OR.  Hand x-ray shows open fracture of the middle and ring fingers.  Labs show worsening kidney function, creatinine 2.7.  However, labs are not far off from 2 weeks ago.  Discussed findings with  patient and sister.  Slight anemia, likely due to CKD.  Patient's blood pressure remaining stable.    Discussed findings and plan with patient's sister (who he lives with and who helps take care of him).  Per sister, patient's PCP is Dr. Farris Has in [place].  Sister was unaware of worsening kidney function, as such this has likely not been discussed with his PCP. Sister and pt live in [place].  Sister states pt will be able to get evaluation of his kidneys upon d/c.   Final Clinical Impressions(s) / ED Diagnoses   Final diagnoses:  Open displaced fracture of middle phalanx of right middle finger, initial encounter  Laceration of right ring finger without foreign body with damage to nail, initial encounter  Elevated serum creatinine    ED Discharge Orders    None       Alveria Apley, PA-C 05/09/19 1535    Alvira Monday, MD 05/13/19 2009

## 2019-05-09 NOTE — H&P (Signed)
Javier Rose is an 58 y.o. male.   Chief Complaint: Crush injury to the right hand involving middle and ring finger. HPI: Crush injury to the right hand involving the middle and ring finger.  Patient has significant disarray the soft tissues open fracture and pain.  I discussed with patient our findings.  I would recommend emergent irrigation debridement and repair is necessary.  He understands this and will proceed accordingly.  We are planning surgery for your upper extremity. The risk and benefits of surgery to include risk of bleeding, infection, anesthesia,  damage to normal structures and failure of the surgery to accomplish its intended goals of relieving symptoms and restoring function have been discussed in detail. With this in mind we plan to proceed. I have specifically discussed with the patient the pre-and postoperative regime and the dos and don'ts and risk and benefits in great detail. Risk and benefits of surgery also include risk of dystrophy(CRPS), chronic nerve pain, failure of the healing process to go onto completion and other inherent risks of surgery The relavent the pathophysiology of the disease/injury process, as well as the alternatives for treatment and postoperative course of action has been discussed in great detail with the patient who desires to proceed.  We will do everything in our power to help you (the patient) restore function to the upper extremity. It is a pleasure to see this patient today.   Patient presents for evaluation and treatment of the of their upper extremity predicament. The patient denies neck, back, chest or  abdominal pain. The patient notes that they have no lower extremity problems. The patients primary complaint is noted. We are planning surgical care pathway for the upper extremity.  Past Medical History:  Diagnosis Date  . GERD (gastroesophageal reflux disease)   . Hepatitis   . History of kidney stones   . Hypertension   . Seizures ([facility])    . TBI (traumatic brain injury) [facility])     Past Surgical History:  Procedure Laterality Date  . HERNIA REPAIR      History reviewed. No pertinent family history. Social History:  reports that he quit smoking about 5 months ago. His smoking use included cigarettes. He smoked 1.00 pack per day. He has never used smokeless tobacco. He reports previous drug use. He reports that he does not drink alcohol.  Allergies: No Known Allergies  Medications Prior to Admission  Medication Sig Dispense Refill  . amitriptyline (ELAVIL) 100 MG tablet Take 100 mg by mouth at bedtime.    Marland Kitchen amLODipine (NORVASC) 10 MG tablet Take 1 tablet (10 mg total) by mouth daily. 30 tablet 3  . diazepam (VALIUM) 10 MG tablet Take 10 mg by mouth every 6 (six) hours as needed for anxiety.    Marland Kitchen esomeprazole (NEXIUM) 20 MG capsule Take 20 mg by mouth daily at 12 noon.    . gabapentin (NEURONTIN) 600 MG tablet Take 1,200 mg by mouth at bedtime.      Results for orders placed or performed during the hospital encounter of 05/09/19 (from the past 48 hour(s))  CBC     Status: Abnormal   Collection Time: 05/09/19 12:40 PM  Result Value Ref Range   WBC 5.2 4.0 - 10.5 K/uL   RBC 3.63 (L) 4.22 - 5.81 MIL/uL   Hemoglobin 10.4 (L) 13.0 - 17.0 g/dL   HCT 43.3 (L) 29.5 - 18.8 %   MCV 88.4 80.0 - 100.0 fL   MCH 28.7 26.0 - 34.0 pg  MCHC 32.4 30.0 - 36.0 g/dL   RDW 40.112.8 02.711.5 - 25.315.5 %   Platelets 233 150 - 400 K/uL   nRBC 0.0 0.0 - 0.2 %    Comment: Performed at [facility], [address] [place], [place] [address]  Basic metabolic panel     Status: Abnormal   Collection Time: 05/09/19 12:40 PM  Result Value Ref Range   Sodium 141 135 - 145 mmol/L   Potassium 4.9 3.5 - 5.1 mmol/L   Chloride 115 (H) 98 - 111 mmol/L   CO2 18 (L) 22 - 32 mmol/L   Glucose, Bld 82 70 - 99 mg/dL   BUN 39 (H) 6 - 20 mg/dL   Creatinine, Ser 3.472.78 (H) 0.61 - 1.24 mg/dL   Calcium 7.9 (L) 8.9 - 10.3 mg/dL   GFR calc non Af Amer 24 (L) >60  mL/min   GFR calc Af Amer 28 (L) >60 mL/min   Anion gap 8 5 - 15    Comment: Performed at [facility], [address] [place], [place] [address]  SARS Coronavirus 2 [facility](Hospital order, Performed in [facility] hospital lab) Nasopharyngeal Nasopharyngeal Swab     Status: None   Collection Time: 05/09/19  2:28 PM   Specimen: Nasopharyngeal Swab  Result Value Ref Range   SARS Coronavirus 2 NEGATIVE NEGATIVE    Comment: (NOTE) If result is NEGATIVE SARS-CoV-2 target nucleic acids are NOT DETECTED. The SARS-CoV-2 RNA is generally detectable in upper and lower  respiratory specimens during the acute phase of infection. The lowest  concentration of SARS-CoV-2 viral copies this assay can detect is 250  copies / mL. A negative result does not preclude SARS-CoV-2 infection  and should not be used as the sole basis for treatment or other  patient management decisions.  A negative result may occur with  improper specimen collection / handling, submission of specimen other  than nasopharyngeal swab, presence of viral mutation(s) within the  areas targeted by this assay, and inadequate number of viral copies  (<250 copies / mL). A negative result must be combined with clinical  observations, patient history, and epidemiological information. If result is POSITIVE SARS-CoV-2 target nucleic acids are DETECTED. The SARS-CoV-2 RNA is generally detectable in upper and lower  respiratory specimens dur ing the acute phase of infection.  Positive  results are indicative of active infection with SARS-CoV-2.  Clinical  correlation with patient history and other diagnostic information is  necessary to determine patient infection status.  Positive results do  not rule out bacterial infection or co-infection with other viruses. If result is PRESUMPTIVE POSTIVE SARS-CoV-2 nucleic acids MAY BE PRESENT.   A presumptive positive result was obtained on the submitted specimen  and confirmed on repeat testing.   While 2019 novel coronavirus  (SARS-CoV-2) nucleic acids may be present in the submitted sample  additional confirmatory testing may be necessary for epidemiological  and / or clinical management purposes  to differentiate between  SARS-CoV-2 and other Sarbecovirus currently known to infect humans.  If clinically indicated additional testing with an alternate test  methodology [phone]([id number]) is advised. The SARS-CoV-2 RNA is generally  detectable in upper and lower respiratory sp ecimens during the acute  phase of infection. The expected result is Negative. Fact Sheet for Patients:  BoilerBrush.com.cyhttps://www.fda.gov/media/136312/download Fact Sheet for Healthcare Providers: https://pope.com/https://www.fda.gov/media/136313/download This test is not yet approved or cleared by the [place] FDA and has been authorized for detection and/or diagnosis of SARS-CoV-2 by FDA  under an Emergency Use Authorization (EUA).  This EUA will remain in effect (meaning this test can be used) for the duration of the COVID-19 declaration under Section 564(b)(1) of the Act, 21 U.S.C. section 360bbb-3(b)(1), unless the authorization is terminated or revoked sooner. Performed at [facility], [place] [address], [place], [place] [address]    Dg Hand Complete Right  Result Date: 05/09/2019 CLINICAL DATA:  Trauma to the third and fourth digits. EXAM: RIGHT HAND - COMPLETE 3+ VIEW COMPARISON:  None. FINDINGS: There is a transverse fracture of the distal third middle phalanx with deep soft tissue injury and question open component of the fracture. There is an oblique fracture of the distal tip of the distal fourth phalanx with associated soft tissue injury extending through the nail. IMPRESSION: 1. Transverse fracture of the distal third middle phalanx with associated marked soft tissue injury and question open component of the fracture. 2. Oblique fracture of the distal tip of the distal fourth phalanx with associated soft tissue injury extending  through the nail. Electronically Signed   By: Fidela Salisbury M.D.   On: 05/09/2019 13:34    Review of Systems  Respiratory: Negative.   Cardiovascular: Negative.     Blood pressure (!) 138/59, pulse (!) 50, temperature 97.9 F (36.6 C), temperature source Oral, resp. rate 16, height 5\' 9"  (1.753 m), weight 78.9 kg, SpO2 95 %. Physical Exam crush injury to the hand with open fractures about the middle and ring finger soft tissue disarray and pain.  We will plan to proceed with surgical intervention as soon as humanly possible.  The patient is alert and oriented in no acute distress. The patient complains of pain in the affected upper extremity.  The patient is noted to have a normal HEENT exam. Lung fields show equal chest expansion and no shortness of breath. Abdomen exam is nontender without distention. Lower extremity examination does not show any fracture dislocation or blood clot symptoms. Pelvis is stable and the neck and back are stable and nontender.  Assessment/Plan We will plan for middle and ring finger reconstruction with repair of nailbed nail plate extensor tendon and bony architecture is necessary.  Patient understands this.    We are planning surgery for your upper extremity. The risk and benefits of surgery to include risk of bleeding, infection, anesthesia,  damage to normal structures and failure of the surgery to accomplish its intended goals of relieving symptoms and restoring function have been discussed in detail. With this in mind we plan to proceed. I have specifically discussed with the patient the pre-and postoperative regime and the dos and don'ts and risk and benefits in great detail. Risk and benefits of surgery also include risk of dystrophy(CRPS), chronic nerve pain, failure of the healing process to go onto completion and other inherent risks of surgery The relavent the pathophysiology of the disease/injury process, as well as the alternatives for  treatment and postoperative course of action has been discussed in great detail with the patient who desires to proceed.  We will do everything in our power to help you (the patient) restore function to the upper extremity. It is a pleasure to see this patient today.   Willa Frater III, MD 05/09/2019, 5:29 PM

## 2019-05-09 NOTE — ED Triage Notes (Signed)
Pt arrived via [facility] due to finger amputation while working on a car. Patient was working under a car when the car came down on car jack and cut off his middle right finger. Per ems, patient also a has a cut on right middle ring finger. Patient rates his pain a 10/10 at this time

## 2019-05-09 NOTE — Anesthesia Preprocedure Evaluation (Addendum)
Anesthesia Evaluation  Patient identified by MRN, date of birth, ID band Patient awake    Reviewed: Allergy & Precautions, NPO status , Patient's Chart, lab work & pertinent test results  Airway Mallampati: II  TM Distance: >3 FB Neck ROM: Full    Dental  (+) Edentulous Upper, Edentulous Lower   Pulmonary former smoker,    Pulmonary exam normal breath sounds clear to auscultation       Cardiovascular hypertension, Pt. on medications  Rhythm:Regular Rate:Bradycardia  ECG: SR   Neuro/Psych Seizures -, Well Controlled,  TBI (traumatic brain injury) negative psych ROS   GI/Hepatic GERD  Medicated and Controlled,(+) Hepatitis -  Endo/Other  negative endocrine ROS  Renal/GU CRFRenal disease     Musculoskeletal negative musculoskeletal ROS (+)   Abdominal   Peds  Hematology  (+) anemia ,   Anesthesia Other Findings right hand injury  Reproductive/Obstetrics                            Anesthesia Physical Anesthesia Plan  ASA: II and emergent  Anesthesia Plan: General   Post-op Pain Management:    Induction: Intravenous  PONV Risk Score and Plan: 2 and Ondansetron, Dexamethasone and Treatment may vary due to age or medical condition  Airway Management Planned: Oral ETT  Additional Equipment:   Intra-op Plan:   Post-operative Plan: Extubation in OR  Informed Consent: I have reviewed the patients History and Physical, chart, labs and discussed the procedure including the risks, benefits and alternatives for the proposed anesthesia with the patient or authorized representative who has indicated his/her understanding and acceptance.     Dental advisory given  Plan Discussed with: CRNA  Anesthesia Plan Comments:         Anesthesia Quick Evaluation

## 2019-05-09 NOTE — Op Note (Signed)
Operative note May 09, 2019  Roseanne Kaufman MD  Preoperative diagnosis #1 right middle finger open extensor tendon injury with bony fracture about the middle phalanx and soft tissue disarray #2 ring finger crush injury with open fracture nail plate nailbed disarray  Postop diagnosis same  Operative procedure: #1 irrigation debridement skin subtenons tissue bone tendon and associated soft tissues right middle finger this was an excisional debridement with curette knife and scissor #2 right middle finger middle phalanx open reduction internal fixation of an open fracture/open treatment of an open fracture right middle finger middle phalanx #3 extensor tendon repair right middle finger #4 5 view radiographic series right middle finger #5 nail plate removal right ring finger #6 irrigation debridement skin subtenons tissue and bone distal phalanx open fracture right ring finger utilizing curette knife and scissor this was an excisional debridement #7 open treatment distal phalanx fracture right ring finger number right complex nailbed repair right ring finger  Surgeon Roseanne Kaufman  Anesthesia General  Complications none  Estimated blood loss minimal  Tourniquet time less than an hour  Operative procedure patient was seen by myself and anesthesia he was counseled extensively in the preop holding area.  This was a crushing injury to the right hand.  The patient had Hibiclens scrub followed by 10-minute surgical Betadine scrub and paint applied to the right upper extremity following this a timeout was observed.  Preoperative antibiotics were given.  At this juncture we performed irrigation debridement of the right middle finger middle phalanx fracture tendon injury and open fracture with soft tissue contamination.  Curette knife and scissors were used to perform irrigation debridement.  Similarly at the same time the right ring finger had the nail plate removed.  This was nail plate removal  followed by irrigation debridement of skin subtenons tissue and bone.  This time 7 L of fluid were placed through and through with cystoscopy tubing to aggressively lavaged the wounds and prevent infection.  Following this 1/2 L of antibiotic irrigant was applied.  Patient tolerated this well.  Once this was complete drapes and towels were changed and the patient underwent ORIF middle phalanx fracture right middle finger.  A 0.045 Kirschner wire was used to enter distally and engage across the DIP joint to harness the fracture which was distal just proximal to the DIP region.  This was pre-bent clipped and then secured.  I cut the tip and the patient had the wire seated nicely.  Following this we performed a complex repair of the extensor apparatus at the distal interphalangeal joint level.  Following this complex wound closure with chromic was accomplished.  Once this was complete attention was turned towards the ring finger.  Open treatment of a distal phalanx fracture was accomplished with setting technique and of course the irrigation had been previously performed.  Following this complex nailbed repair and complex nail fold repair was accomplished with 5-0 and 6-0 chromic suture.  The patient tolerated this well.  Adaptic was placed under the eponychial fold and the tourniquet was deflated refill was adequate there was no complicating features I dressing of Mepitel Xeroform gauze Kling Curlex and splints including finger and a fiberglass splint was applied.  Maximum elevation IV antibiotics and observation overnight will likely DC him home tomorrow if he is looking well.  The patient tolerated the procedure well.  We are very aggressive to try to prevent infection with a good thorough debridement tonight.  Roseanne Kaufman MD

## 2019-05-09 NOTE — Progress Notes (Signed)
Notified sister Melton Krebs [phone])  Room number. Told call her when he is discharge will pick him up.

## 2019-05-10 ENCOUNTER — Other Ambulatory Visit: Payer: Self-pay

## 2019-05-10 LAB — GLUCOSE, CAPILLARY: Glucose-Capillary: 105 mg/dL — ABNORMAL HIGH (ref 70–99)

## 2019-05-10 MED ORDER — HYDROCODONE-ACETAMINOPHEN 5-325 MG PO TABS: 1.0000 | ORAL_TABLET | ORAL | 0 refills | Status: DC | PRN

## 2019-05-10 MED ORDER — PANTOPRAZOLE SODIUM 40 MG PO TBEC
40.0000 mg | DELAYED_RELEASE_TABLET | Freq: Every day | ORAL | Status: DC
  Administered 2019-05-10: 40 mg via ORAL
  Filled 2019-05-10: qty 1

## 2019-05-10 MED ORDER — CEPHALEXIN 500 MG PO CAPS: 500.0000 mg | ORAL_CAPSULE | Freq: Four times a day (QID) | ORAL | 0 refills | Status: AC
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
## 2019-05-10 NOTE — Plan of Care (Signed)

## 2019-05-10 NOTE — Discharge Instructions (Signed)
°  Please elevate your arm.  Please take the antibiotics until they are complete.  Please make sure to see Dr. Phillip Heal make in 14 days.  Please call his office for an appointment.  Please call for any problems.  You have a antibiotic and a pain medicine prescription called into your pharmacy.  We recommend that you to take vitamin C 1000 mg a day to promote healing. We also recommend that if you require  pain medicine that you take a stool softener to prevent constipation as most pain medicines will have constipation side effects. We recommend either Peri-Colace or Senokot and recommend that you also consider adding MiraLAX as well to prevent the constipation affects from pain medicine if you are required to use them. These medicines are over the counter and may be purchased at a local pharmacy. A cup of yogurt and a probiotic can also be helpful during the recovery process as the medicines can disrupt your intestinal environment. Keep bandage clean and dry.  Call for any problems.  No smoking.  Criteria for driving a car: you should be off your pain medicine for 7-8 hours, able to drive one handed(confident), thinking clearly and feeling able in your judgement to drive. Continue elevation as it will decrease swelling.  If instructed by MD move your fingers within the confines of the bandage/splint.  Use ice if instructed by your MD. Call immediately for any sudden loss of feeling in your hand/arm or change in functional abilities of the extremity.        It is very poor energy follow-up with your primary care doctor regarding your kidney function.  Your kidney function (SCr) has gone from 1.4-2.7 in the past 6 months.

## 2019-05-12 ENCOUNTER — Encounter (HOSPITAL_COMMUNITY): Payer: Self-pay | Admitting: Orthopedic Surgery

## 2019-05-27 ENCOUNTER — Emergency Department (HOSPITAL_COMMUNITY): Payer: Medicaid - Out of State

## 2019-05-27 ENCOUNTER — Inpatient Hospital Stay (HOSPITAL_COMMUNITY)
Admission: EM | Admit: 2019-05-27 | Discharge: 2019-05-31 | DRG: 312 | Disposition: A | Discharge status: Home / Self Care | Payer: Medicaid - Out of State | Attending: Internal Medicine | Admitting: Internal Medicine

## 2019-05-27 ENCOUNTER — Encounter (HOSPITAL_COMMUNITY): Payer: Self-pay | Admitting: Emergency Medicine

## 2019-05-27 ENCOUNTER — Other Ambulatory Visit: Payer: Self-pay

## 2019-05-27 DIAGNOSIS — Z87891 Personal history of nicotine dependence: Secondary | ICD-10-CM

## 2019-05-27 DIAGNOSIS — D61818 Other pancytopenia: Secondary | ICD-10-CM | POA: Diagnosis present

## 2019-05-27 DIAGNOSIS — U071 COVID-19: Secondary | ICD-10-CM | POA: Diagnosis present

## 2019-05-27 DIAGNOSIS — I129 Hypertensive chronic kidney disease with stage 1 through stage 4 chronic kidney disease, or unspecified chronic kidney disease: Secondary | ICD-10-CM | POA: Diagnosis present

## 2019-05-27 DIAGNOSIS — I951 Orthostatic hypotension: Principal | ICD-10-CM | POA: Diagnosis present

## 2019-05-27 DIAGNOSIS — F419 Anxiety disorder, unspecified: Secondary | ICD-10-CM | POA: Diagnosis present

## 2019-05-27 DIAGNOSIS — K219 Gastro-esophageal reflux disease without esophagitis: Secondary | ICD-10-CM | POA: Diagnosis present

## 2019-05-27 DIAGNOSIS — R55 Syncope and collapse: Secondary | ICD-10-CM

## 2019-05-27 DIAGNOSIS — R42 Dizziness and giddiness: Secondary | ICD-10-CM | POA: Diagnosis not present

## 2019-05-27 DIAGNOSIS — N400 Enlarged prostate without lower urinary tract symptoms: Secondary | ICD-10-CM | POA: Diagnosis present

## 2019-05-27 DIAGNOSIS — Z87820 Personal history of traumatic brain injury: Secondary | ICD-10-CM

## 2019-05-27 DIAGNOSIS — I498 Other specified cardiac arrhythmias: Secondary | ICD-10-CM

## 2019-05-27 DIAGNOSIS — I495 Sick sinus syndrome: Secondary | ICD-10-CM | POA: Diagnosis present

## 2019-05-27 DIAGNOSIS — F1410 Cocaine abuse, uncomplicated: Secondary | ICD-10-CM | POA: Diagnosis present

## 2019-05-27 DIAGNOSIS — I471 Supraventricular tachycardia: Secondary | ICD-10-CM | POA: Diagnosis present

## 2019-05-27 DIAGNOSIS — Z9181 History of falling: Secondary | ICD-10-CM

## 2019-05-27 DIAGNOSIS — N179 Acute kidney failure, unspecified: Secondary | ICD-10-CM | POA: Diagnosis present

## 2019-05-27 DIAGNOSIS — W19XXXA Unspecified fall, initial encounter: Secondary | ICD-10-CM | POA: Diagnosis present

## 2019-05-27 DIAGNOSIS — Z79899 Other long term (current) drug therapy: Secondary | ICD-10-CM

## 2019-05-27 DIAGNOSIS — N183 Chronic kidney disease, stage 3 (moderate): Secondary | ICD-10-CM | POA: Diagnosis present

## 2019-05-27 DIAGNOSIS — R9431 Abnormal electrocardiogram [ECG] [EKG]: Secondary | ICD-10-CM

## 2019-05-27 DIAGNOSIS — G4089 Other seizures: Secondary | ICD-10-CM | POA: Diagnosis present

## 2019-05-27 DIAGNOSIS — S01112A Laceration without foreign body of left eyelid and periocular area, initial encounter: Secondary | ICD-10-CM | POA: Diagnosis present

## 2019-05-27 LAB — RAPID URINE DRUG SCREEN, HOSP PERFORMED
Amphetamines: NOT DETECTED
Barbiturates: NOT DETECTED
Benzodiazepines: POSITIVE — AB
Cocaine: POSITIVE — AB
Opiates: NOT DETECTED
Tetrahydrocannabinol: NOT DETECTED

## 2019-05-27 LAB — CBC WITH DIFFERENTIAL/PLATELET
Abs Immature Granulocytes: 0.02 10*3/uL (ref 0.00–0.07)
Basophils Absolute: 0.1 10*3/uL (ref 0.0–0.1)
Basophils Relative: 1 %
Eosinophils Absolute: 0.2 10*3/uL (ref 0.0–0.5)
Eosinophils Relative: 2 %
HCT: 41.8 % (ref 39.0–52.0)
Hemoglobin: 13.4 g/dL (ref 13.0–17.0)
Immature Granulocytes: 0 %
Lymphocytes Relative: 11 %
Lymphs Abs: 0.8 10*3/uL (ref 0.7–4.0)
MCH: 28.8 pg (ref 26.0–34.0)
MCHC: 32.1 g/dL (ref 30.0–36.0)
MCV: 89.9 fL (ref 80.0–100.0)
Monocytes Absolute: 0.3 10*3/uL (ref 0.1–1.0)
Monocytes Relative: 5 %
Neutro Abs: 5.7 10*3/uL (ref 1.7–7.7)
Neutrophils Relative %: 81 %
Platelets: 149 10*3/uL — ABNORMAL LOW (ref 150–400)
RBC: 4.65 MIL/uL (ref 4.22–5.81)
RDW: 13 % (ref 11.5–15.5)
WBC: 7.1 10*3/uL (ref 4.0–10.5)
nRBC: 0 % (ref 0.0–0.2)

## 2019-05-27 LAB — COMPREHENSIVE METABOLIC PANEL
ALT: 10 U/L (ref 0–44)
AST: 12 U/L — ABNORMAL LOW (ref 15–41)
Albumin: 3.9 g/dL (ref 3.5–5.0)
Alkaline Phosphatase: 92 U/L (ref 38–126)
Anion gap: 9 (ref 5–15)
BUN: 44 mg/dL — ABNORMAL HIGH (ref 6–20)
CO2: 24 mmol/L (ref 22–32)
Calcium: 9 mg/dL (ref 8.9–10.3)
Chloride: 104 mmol/L (ref 98–111)
Creatinine, Ser: 2.45 mg/dL — ABNORMAL HIGH (ref 0.61–1.24)
GFR calc Af Amer: 32 mL/min — ABNORMAL LOW (ref >60)
GFR calc non Af Amer: 28 mL/min — ABNORMAL LOW (ref >60)
Glucose, Bld: 109 mg/dL — ABNORMAL HIGH (ref 70–99)
Potassium: 5 mmol/L (ref 3.5–5.1)
Sodium: 137 mmol/L (ref 135–145)
Total Bilirubin: 0.2 mg/dL — ABNORMAL LOW (ref 0.3–1.2)
Total Protein: 7 g/dL (ref 6.5–8.1)

## 2019-05-27 LAB — URINALYSIS, ROUTINE W REFLEX MICROSCOPIC
Bilirubin Urine: NEGATIVE
Glucose, UA: NEGATIVE mg/dL
Hgb urine dipstick: NEGATIVE
Ketones, ur: NEGATIVE mg/dL
Leukocytes,Ua: NEGATIVE
Nitrite: NEGATIVE
Protein, ur: NEGATIVE mg/dL
Specific Gravity, Urine: 1.013 (ref 1.005–1.030)
pH: 6 (ref 5.0–8.0)

## 2019-05-27 LAB — ETHANOL: Alcohol, Ethyl (B): <10 mg/dL (ref <10)

## 2019-05-27 LAB — TROPONIN I (HIGH SENSITIVITY)
Troponin I (High Sensitivity): 3 ng/L (ref <18)
Troponin I (High Sensitivity): 3 ng/L (ref <18)

## 2019-05-27 LAB — MAGNESIUM: Magnesium: 2 mg/dL (ref 1.7–2.4)

## 2019-05-27 LAB — CBG MONITORING, ED: Glucose-Capillary: 93 mg/dL (ref 70–99)

## 2019-05-27 MED ORDER — SODIUM CHLORIDE 0.9 % IV SOLN
INTRAVENOUS | Status: DC
  Administered 2019-05-27: 14:00:00 via INTRAVENOUS

## 2019-05-27 MED ORDER — GABAPENTIN 600 MG PO TABS
1200.0000 mg | ORAL_TABLET | Freq: Every day | ORAL | Status: DC
  Filled 2019-05-27 (×2): qty 2

## 2019-05-27 MED ORDER — ONDANSETRON HCL 4 MG/2ML IJ SOLN
4.0000 mg | Freq: Once | INTRAMUSCULAR | Status: AC
  Administered 2019-05-27: 14:00:00 4 mg via INTRAVENOUS
  Filled 2019-05-27: qty 2

## 2019-05-27 MED ORDER — GABAPENTIN 300 MG PO CAPS
ORAL_CAPSULE | ORAL | Status: AC
  Filled 2019-05-27: qty 4

## 2019-05-27 MED ORDER — GABAPENTIN 300 MG PO CAPS
1200.0000 mg | ORAL_CAPSULE | Freq: Every day | ORAL | Status: DC
  Administered 2019-05-27 – 2019-05-30 (×4): 1200 mg via ORAL
  Filled 2019-05-27 (×4): qty 4

## 2019-05-27 MED ORDER — ALUM & MAG HYDROXIDE-SIMETH 200-200-20 MG/5ML PO SUSP
30.0000 mL | ORAL | Status: DC | PRN
  Administered 2019-05-27: 23:00:00 30 mL via ORAL
  Filled 2019-05-27: qty 30

## 2019-05-27 MED ORDER — PANTOPRAZOLE SODIUM 40 MG PO TBEC
40.0000 mg | DELAYED_RELEASE_TABLET | Freq: Every day | ORAL | Status: DC
  Administered 2019-05-27 – 2019-05-31 (×5): 40 mg via ORAL
  Filled 2019-05-27 (×5): qty 1

## 2019-05-27 MED ORDER — ONDANSETRON HCL 4 MG/2ML IJ SOLN
4.0000 mg | Freq: Four times a day (QID) | INTRAMUSCULAR | Status: DC | PRN
  Administered 2019-05-27: 23:00:00 4 mg via INTRAVENOUS
  Filled 2019-05-27: qty 2

## 2019-05-27 MED ORDER — SODIUM CHLORIDE 0.9 % IV SOLN
INTRAVENOUS | Status: AC
  Administered 2019-05-27 – 2019-05-28 (×2): via INTRAVENOUS

## 2019-05-27 MED ORDER — AMITRIPTYLINE HCL 50 MG PO TABS
100.0000 mg | ORAL_TABLET | Freq: Every day | ORAL | Status: DC
  Administered 2019-05-27 – 2019-05-30 (×4): 100 mg via ORAL
  Filled 2019-05-27: qty 4
  Filled 2019-05-27 (×3): qty 2

## 2019-05-27 MED ORDER — DIAZEPAM 5 MG PO TABS
10.0000 mg | ORAL_TABLET | Freq: Three times a day (TID) | ORAL | Status: DC | PRN
  Administered 2019-05-29 – 2019-05-31 (×6): 10 mg via ORAL
  Filled 2019-05-27 (×7): qty 2

## 2019-05-27 MED ORDER — SODIUM CHLORIDE 0.9 % IV BOLUS
1000.0000 mL | Freq: Once | INTRAVENOUS | Status: AC
  Administered 2019-05-27: 13:00:00 1000 mL via INTRAVENOUS

## 2019-05-27 MED ORDER — ENOXAPARIN SODIUM 40 MG/0.4ML ~~LOC~~ SOLN: 40.0000 mg | SUBCUTANEOUS | Status: DC

## 2019-05-27 MED ORDER — FENTANYL CITRATE (PF) 100 MCG/2ML IJ SOLN
50.0000 ug | Freq: Once | INTRAMUSCULAR | Status: AC
  Administered 2019-05-27: 50 ug via INTRAVENOUS
  Filled 2019-05-27: qty 2

## 2019-05-27 MED ORDER — KETOROLAC TROMETHAMINE 30 MG/ML IJ SOLN
30.0000 mg | Freq: Once | INTRAMUSCULAR | Status: AC
  Administered 2019-05-27: 16:00:00 30 mg via INTRAVENOUS
  Filled 2019-05-27: qty 1

## 2019-05-27 MED ORDER — HYDROCODONE-ACETAMINOPHEN 5-325 MG PO TABS
1.0000 | ORAL_TABLET | ORAL | Status: AC | PRN
  Administered 2019-05-27 – 2019-05-28 (×2): 1 via ORAL
  Filled 2019-05-27 (×2): qty 1

## 2019-05-27 MED ORDER — ACETAMINOPHEN 325 MG PO TABS
650.0000 mg | ORAL_TABLET | Freq: Four times a day (QID) | ORAL | Status: DC | PRN
  Administered 2019-05-29 – 2019-05-31 (×3): 650 mg via ORAL
  Filled 2019-05-27 (×6): qty 2

## 2019-05-27 MED ORDER — POLYETHYLENE GLYCOL 3350 17 G PO PACK: 17.0000 g | PACK | Freq: Every day | ORAL | Status: DC | PRN

## 2019-05-27 MED ORDER — ONDANSETRON HCL 4 MG PO TABS
4.0000 mg | ORAL_TABLET | Freq: Four times a day (QID) | ORAL | Status: DC | PRN
  Administered 2019-05-28: 4 mg via ORAL
  Filled 2019-05-27: qty 1

## 2019-05-27 MED ORDER — ENOXAPARIN SODIUM 30 MG/0.3ML ~~LOC~~ SOLN
30.0000 mg | SUBCUTANEOUS | Status: DC
  Filled 2019-05-27: qty 0.3

## 2019-05-27 MED ORDER — ACETAMINOPHEN 650 MG RE SUPP: 650.0000 mg | Freq: Four times a day (QID) | RECTAL | Status: DC | PRN

## 2019-05-27 NOTE — ED Triage Notes (Signed)
Pt family called ems for him having 2 seizures with hx of the same.

## 2019-05-27 NOTE — ED Provider Notes (Signed)
[facility] EMERGENCY DEPARTMENT Provider Note   CSN: [id number] Arrival date & time: 05/27/19  1047     History   Chief Complaint Chief Complaint  Patient presents with  . Seizures    HPI Javier Rose is a 58 y.o. male with history of TBI, seizures, hypertension, hepatitis, GERD presents for evaluation of acute onset, persistent generalized weakness, lightheadedness and possible seizure-like activity.  He tells me for the last 2 days when going from laying to standing he will become very lightheaded and he "knows to lower myself to the ground ".  He describes an episode yesterday evening and again this morning as he was ambulating around his trailer when he began to feel very lightheaded as though he may lose consciousness.  He thinks he did strike his head on the kitchen table yesterday and has a small laceration over his left eyebrow that has since closed.  Had a similar episode again this morning and after regaining consciousness had a few episodes of nonbloody nonbilious emesis.  Denies abdominal pain, chest pain, shortness of breath.  Reports he has a mild occipital headache, denies vision changes, numbness or weakness of his extremities.  No fevers, chills, cough, or urinary symptoms per the patient. No urinary incontinence. Episode this morning was witnessed by his sister. He tells me that he has a history of seizures and has been compliant with his medications but he does not know what he takes for seizures.  Denies recreational drug use or alcohol intake.  He is a former smoker.     The history is provided by the patient.    Past Medical History:  Diagnosis Date  . GERD (gastroesophageal reflux disease)   . Hepatitis   . History of kidney stones   . Hypertension   . Seizures (HCC)   . TBI (traumatic brain injury) [facility])     Patient Active Problem List   Diagnosis Date Noted  . Postural dizziness with presyncope 05/27/2019  . Crushed hand and fingers 05/09/2019  .  Crushing injury of right hand 05/09/2019  . AKI (acute kidney injury) (HCC)   . Dehydration   . Orthostatic hypotension   . Gastroesophageal reflux disease   . Dizziness 12/16/2018  . HTN (hypertension) 12/16/2018  . Kidney stones 12/16/2018  . BPH (benign prostatic hyperplasia) 12/16/2018    Past Surgical History:  Procedure Laterality Date  . HERNIA REPAIR    . NERVE, TENDON AND ARTERY REPAIR Right 05/09/2019   Procedure: RIGHT HAND RECONSTRUCTION/MIDDLE AND RING FINGER;  Surgeon: Dominica Severin, MD;  Location: [facility] OR;  Service: Orthopedics;  Laterality: Right;        Home Medications    Prior to Admission medications   Medication Sig Start Date End Date Taking? Authorizing Provider  amitriptyline (ELAVIL) 100 MG tablet Take 100 mg by mouth at bedtime.   Yes [provider]  amLODipine (NORVASC) 10 MG tablet Take 1 tablet (10 mg total) by mouth daily. 12/18/18 12/18/19 Yes Vassie Loll, MD  diazepam (VALIUM) 10 MG tablet Take 10 mg by mouth every 6 (six) hours as needed for anxiety.   Yes [provider]  esomeprazole (NEXIUM) 20 MG capsule Take 20 mg by mouth daily at 12 noon.   Yes [provider]  gabapentin (NEURONTIN) 600 MG tablet Take 1,200 mg by mouth at bedtime.   Yes [provider]  HYDROcodone-acetaminophen (NORCO/VICODIN) 5-325 MG tablet Take 1-2 tablets by mouth every 4 (four) hours as needed for moderate pain (pain  score 4-6). 05/10/19  Yes Roseanne Kaufman, MD    Family History No family history on file.  Social History Social History   Tobacco Use  . Smoking status: Former Smoker    Packs/day: 1.00    Types: Cigarettes    Quit date: 11/16/2018    Years since quitting: 0.5  . Smokeless tobacco: Never Used  Substance Use Topics  . Alcohol use: Never    Frequency: Never  . Drug use: Not Currently     Allergies   Patient has no known allergies.   Review of Systems Review of Systems  Constitutional: Negative for  chills and fever.  Respiratory: Negative for shortness of breath.   Cardiovascular: Negative for chest pain.  Gastrointestinal: Negative for abdominal pain, nausea and vomiting.  Neurological: Positive for seizures (? possible), syncope (? possible seizures) and light-headedness.  All other systems reviewed and are negative.    Physical Exam Updated Vital Signs BP 139/61 (BP Location: Left Arm)   Pulse (!) 47   Temp 98.4 F (36.9 C) (Oral)   Resp 16   Ht 5\' 9"  (1.753 m)   Wt 79.4 kg   SpO2 99%   BMI 25.84 kg/m   Physical Exam Vitals signs and nursing note reviewed.  Constitutional:      General: He is not in acute distress.    Appearance: He is well-developed.  HENT:     Head: Normocephalic.     Comments: 1 cm laceration overlying the left eyebrow, scabbed over and well-healing. No surrounding erythema or abnormal drainage.  Bleeding controlled.  No Battle's signs, no raccoon's eyes, no rhinorrhea. No hemotympanum. No tenderness to palpation of the face or skull. No deformity, crepitus, or swelling noted.  Eyes:     General:        Right eye: No discharge.        Left eye: No discharge.     Extraocular Movements: Extraocular movements intact.     Conjunctiva/sclera: Conjunctivae normal.     Pupils: Pupils are equal, round, and reactive to light.  Neck:     Musculoskeletal: Normal range of motion and neck supple.     Vascular: No JVD.     Trachea: No tracheal deviation.  Cardiovascular:     Rate and Rhythm: Regular rhythm. Bradycardia present.     Pulses: Normal pulses.     Comments: 2+ radial and DP/PT pulses bilaterally, no lower extremity edema Pulmonary:     Effort: Pulmonary effort is normal.     Comments: Globally diminished breath sounds, speaking in full sentences without difficulty. Abdominal:     General: Abdomen is flat. There is no distension.     Palpations: Abdomen is soft.     Tenderness: There is no guarding or rebound.     Comments: Mild  epigastric discomfort on palpation  Skin:    General: Skin is warm and dry.     Comments: Right hand with wounds to the dorsum of the third and fourth digits some surrounding erythema and swelling but no abnormal drainage.  Neurological:     Mental Status: He is alert.     Comments: Mental Status:  Alert, thought content appropriate, mildly confused to events leading up to presentation. Speech fluent without evidence of aphasia. Able to follow 2 step commands without difficulty.  Cranial Nerves:  II:  pupils equal, round, reactive to light III,IV, VI: ptosis not present, extra-ocular motions intact bilaterally  V,VII: smile symmetric, facial light touch sensation equal VIII:  hearing grossly normal to voice  X: uvula elevates symmetrically  XI: bilateral shoulder shrug symmetric and strong XII: midline tongue extension without fassiculations Motor:  Normal tone. 5/5 strength of BUE and BLE major muscle groups including strong and equal grip strength and dorsiflexion/plantar flexion Sensory: light touch normal in all extremities.  Psychiatric:        Behavior: Behavior normal.      ED Treatments / Results  Labs (all labs ordered are listed, but only abnormal results are displayed) Labs Reviewed  CBC WITH DIFFERENTIAL/PLATELET - Abnormal; Notable for the following components:      Result Value   Platelets 149 (*)    All other components within normal limits  COMPREHENSIVE METABOLIC PANEL - Abnormal; Notable for the following components:   Glucose, Bld 109 (*)    BUN 44 (*)    Creatinine, Ser 2.45 (*)    AST 12 (*)    Total Bilirubin 0.2 (*)    GFR calc non Af Amer 28 (*)    GFR calc Af Amer 32 (*)    All other components within normal limits  RAPID URINE DRUG SCREEN, HOSP PERFORMED - Abnormal; Notable for the following components:   Cocaine POSITIVE (*)    Benzodiazepines POSITIVE (*)    All other components within normal limits  SARS CORONAVIRUS 2 (TAT 6-24 HRS)  ETHANOL   URINALYSIS, ROUTINE W REFLEX MICROSCOPIC  MAGNESIUM  CBG MONITORING, ED  TROPONIN I (HIGH SENSITIVITY)  TROPONIN I (HIGH SENSITIVITY)    EKG EKG Interpretation  Date/Time:  Wednesday May 27 2019 10:53:04 EDT Ventricular Rate:  57 PR Interval:    QRS Duration: 125 QT Interval:  459 QTC Calculation: 447 R Axis:   67 Text Interpretation:  Junctional rhythm Nonspecific intraventricular conduction delay Confirmed by Raeford Razor [phone]) on 05/27/2019 12:43:24 PM   Radiology Ct Head Wo Contrast  Result Date: 05/27/2019 CLINICAL DATA:  Altered level of consciousness EXAM: CT HEAD WITHOUT CONTRAST CT CERVICAL SPINE WITHOUT CONTRAST TECHNIQUE: Multidetector CT imaging of the head and cervical spine was performed following the standard protocol without intravenous contrast. Multiplanar CT image reconstructions of the cervical spine were also generated. COMPARISON:  04/29/2019 FINDINGS: CT HEAD FINDINGS Brain: No evidence of acute infarction, hemorrhage, hydrocephalus, extra-axial collection or mass lesion/mass effect. Vascular: No hyperdense vessel or unexpected calcification. Skull: Normal. Negative for fracture or focal lesion. Sinuses/Orbits: No acute finding. Other: None. CT CERVICAL SPINE FINDINGS Alignment: Normal. Skull base and vertebrae: No acute fracture. No primary bone lesion or focal pathologic process. Soft tissues and spinal canal: No prevertebral fluid or swelling. No visible canal hematoma. Disc levels: Moderate disc degenerative and osteophytosis of the lower cervical spine. Upper chest: Negative. Other: None. IMPRESSION: 1.  No acute intracranial pathology. 2.  No fracture or static subluxation of the cervical spine. Electronically Signed   By: Lauralyn Primes M.D.   On: 05/27/2019 13:23   Ct Cervical Spine Wo Contrast  Result Date: 05/27/2019 CLINICAL DATA:  Altered level of consciousness EXAM: CT HEAD WITHOUT CONTRAST CT CERVICAL SPINE WITHOUT CONTRAST TECHNIQUE:  Multidetector CT imaging of the head and cervical spine was performed following the standard protocol without intravenous contrast. Multiplanar CT image reconstructions of the cervical spine were also generated. COMPARISON:  04/29/2019 FINDINGS: CT HEAD FINDINGS Brain: No evidence of acute infarction, hemorrhage, hydrocephalus, extra-axial collection or mass lesion/mass effect. Vascular: No hyperdense vessel or unexpected calcification. Skull: Normal. Negative for fracture or focal lesion. Sinuses/Orbits: No acute finding. Other: None.  CT CERVICAL SPINE FINDINGS Alignment: Normal. Skull base and vertebrae: No acute fracture. No primary bone lesion or focal pathologic process. Soft tissues and spinal canal: No prevertebral fluid or swelling. No visible canal hematoma. Disc levels: Moderate disc degenerative and osteophytosis of the lower cervical spine. Upper chest: Negative. Other: None. IMPRESSION: 1.  No acute intracranial pathology. 2.  No fracture or static subluxation of the cervical spine. Electronically Signed   By: Lauralyn Primes M.D.   On: 05/27/2019 13:23    Procedures Procedures (including critical care time)  Medications Ordered in ED Medications  amitriptyline (ELAVIL) tablet 100 mg (100 mg Oral Given 05/27/19 2115)  diazepam (VALIUM) tablet 10 mg (has no administration in time range)  pantoprazole (PROTONIX) EC tablet 40 mg (has no administration in time range)  0.9 %  sodium chloride infusion ( Intravenous New Bag/Given 05/27/19 2121)  acetaminophen (TYLENOL) tablet 650 mg (has no administration in time range)    Or  acetaminophen (TYLENOL) suppository 650 mg (has no administration in time range)  ondansetron (ZOFRAN) tablet 4 mg (has no administration in time range)    Or  ondansetron (ZOFRAN) injection 4 mg (has no administration in time range)  polyethylene glycol (MIRALAX / GLYCOLAX) packet 17 g (has no administration in time range)  enoxaparin (LOVENOX) injection 30 mg (has no  administration in time range)  gabapentin (NEURONTIN) capsule 1,200 mg (1,200 mg Oral Given 05/27/19 2115)  alum & mag hydroxide-simeth (MAALOX/MYLANTA) 200-200-20 MG/5ML suspension 30 mL (has no administration in time range)  sodium chloride 0.9 % bolus 1,000 mL (0 mLs Intravenous Stopped 05/27/19 1418)  ondansetron (ZOFRAN) injection 4 mg (4 mg Intravenous Given 05/27/19 1334)  ketorolac (TORADOL) 30 MG/ML injection 30 mg (30 mg Intravenous Given 05/27/19 1628)  fentaNYL (SUBLIMAZE) injection 50 mcg (50 mcg Intravenous Given 05/27/19 1628)  gabapentin (NEURONTIN) 300 MG capsule (has no administration in time range)     Initial Impression / Assessment and Plan / ED Course  I have reviewed the triage vital signs and the nursing notes.  Pertinent labs & imaging results that were available during my care of the patient were reviewed by me and considered in my medical decision making (see chart for details).        Patient presenting for evaluation of syncope and collapse, primarily when going from sitting to standing.  Has had several episodes over the last few days.  He is afebrile, intermittently hypotensive in the ED and he is orthostatic.  We will give IV fluid bolus.  No focal neurologic deficits on my assessment.  No signs of serious head trauma.  Head CT shows no acute intracranial abnormalities no cervical spine injury.  Lab work reviewed by me shows no leukocytosis, no anemia, CKD is around patient's baseline compared to most recent lab work.   1:05 PM CONSULT: Patient's EKG is abnormal, shows a new junctional rhythm.  Spoke with Dr. Diona Browner with cardiology who reviewed the patient's EKG. suspects that patient has a junctional rhythm with isorhythmic dissociation.  He recommends admission for telemetry monitoring; unsure if this new rhythm could be contributing to his syncopal episodes but certainly warrants further investigation.  Serial troponins are negative, low suspicion of ACS/MI  and the patient has no chest pain.  Spoke with Dr. Mariea Clonts with [facility] hospitalist service who agrees to assume care of patient and bring him into the hospital for further evaluation and management.  Final Clinical Impressions(s) / ED Diagnoses   Final diagnoses:  Syncope and  collapse  Abnormal EKG  Junctional rhythm    ED Discharge Orders    None       Bennye AlmFawze, Marye Eagen A, PA-C 05/27/19 2236    Raeford RazorKohut, Stephen, MD 05/28/19 1051

## 2019-05-27 NOTE — ED Notes (Signed)
Advised patient we needed urine specimen and urinal at bedside. 

## 2019-05-27 NOTE — H&P (Addendum)
History and Physical    Javier Rose QJF:[id number] DOB: [birth year] DOA: 05/27/2019  PCP: Javier Sells, MD   Patient coming from: Home  I have personally briefly reviewed patient's old medical records in [facility]  Chief Complaint: Lightheadedness, Falls  HPI: Javier Rose is a 58 y.o. male with medical history significant for hypertension, BPH, traumatic brain injury, seizures, hepatitis.  Patient presented to the ED today with complaints of multiple episodes of lightheadedness that have resulted in 2 falls, yesterday and this morning, sustaining a small laceration to his left eyebrow after the first fall.  Reports symptoms of lightheadedness at present only when he stands up, and so he carefully lies/sits down.  He reports when this episode starts he suddenly has a feeling that he cannot catch his breath, and when the feeling of lightheadedness passes his breathing is back to normal.  He he never lost consciousness. He hit his head when he fell.  He denies chest pains.  Reports only one episode of vomiting this morning, no diarrhea, has maintained good p.o. intake.  Ports a history of seizures compliant with his medications but he does not know the name of the medication.  He tells me that these episodes of lightheadedness are different from his seizure episodes.  Patient was admitted for symptomatic orthostatic hypotension with dizziness April this year.  At that time etiology was thought to be his diuretics chlorthalidone and lisinopril.  Both of these were stopped and he was started on Norvasc 10 mg daily. He tells me at home his blood pressure fluctuates, and he adjusts his dose of Norvasc accordingly.   ED Course: Blood pressure systolic fluctuating from systolic 85-153 but patient has received 1 L bolus normal saline in the ED.  Positive orthostatic vitals, with systolic 134 dropping to 102.  EKG showed junctional rhythm.  Hs  troponin 32.  Cervical CT negative for acute  abnormality.  EDP talked to cardiologist on-call, recommended admission and monitoring on telemetry, unsure if this new rhythm could be contributing to his syncopal episodes.   Review of Systems: As per HPI all other systems reviewed and negative.  Past Medical History:  Diagnosis Date   GERD (gastroesophageal reflux disease)    Hepatitis    History of kidney stones    Hypertension    Seizures (HCC)    TBI (traumatic brain injury) [facility])     Past Surgical History:  Procedure Laterality Date   HERNIA REPAIR     NERVE, TENDON AND ARTERY REPAIR Right 05/09/2019   Procedure: RIGHT HAND RECONSTRUCTION/MIDDLE AND RING FINGER;  Surgeon: Dominica Severin, MD;  Location: [facility] OR;  Service: Orthopedics;  Laterality: Right;     reports that he quit smoking about 6 months ago. His smoking use included cigarettes. He smoked 1.00 pack per day. He has never used smokeless tobacco. He reports previous drug use. He reports that he does not drink alcohol.  No Known Allergies  Family history of hypertension.  Prior to Admission medications   Medication Sig Start Date End Date Taking? Authorizing Provider  amitriptyline (ELAVIL) 100 MG tablet Take 100 mg by mouth at bedtime.   Yes [provider]  amLODipine (NORVASC) 10 MG tablet Take 1 tablet (10 mg total) by mouth daily. 12/18/18 12/18/19 Yes Vassie Loll, MD  diazepam (VALIUM) 10 MG tablet Take 10 mg by mouth every 6 (six) hours as needed for anxiety.   Yes [provider]  esomeprazole (NEXIUM) 20 MG capsule Take 20 mg by  mouth daily at 12 noon.   Yes [provider]  gabapentin (NEURONTIN) 600 MG tablet Take 1,200 mg by mouth at bedtime.   Yes [provider]  HYDROcodone-acetaminophen (NORCO/VICODIN) 5-325 MG tablet Take 1-2 tablets by mouth every 4 (four) hours as needed for moderate pain (pain score 4-6). 05/10/19  Yes Roseanne Kaufman, MD    Physical Exam: Vitals:   05/27/19 1445 05/27/19 1500 05/27/19  1515 05/27/19 1530  BP:  106/79  (!) 153/75  Pulse:   (!) 50 (!) 54  Resp: 18 16 10 13   Temp:      TempSrc:      SpO2:   98% 98%  Weight:      Height:        Constitutional: NAD, calm, comfortable Vitals:   05/27/19 1445 05/27/19 1500 05/27/19 1515 05/27/19 1530  BP:  106/79  (!) 153/75  Pulse:   (!) 50 (!) 54  Resp: 18 16 10 13   Temp:      TempSrc:      SpO2:   98% 98%  Weight:      Height:       Eyes: PERRL, lids and conjunctivae normal.  Small healing and scabbed over laceration to left eyebrow. ENMT: Mucous membranes are moist. Posterior pharynx clear of any exudate or lesions.  Neck: normal, supple, no masses, no thyromegaly Respiratory: clear to auscultation bilaterally, no wheezing, no crackles. Normal respiratory effort. No accessory muscle use.  Cardiovascular: Regular rate and rhythm, no murmurs / rubs / gallops.  2+ pedal pulses.  Abdomen: no tenderness, no masses palpated. No hepatosplenomegaly. Bowel sounds positive.  Musculoskeletal: no clubbing / cyanosis. No joint deformity upper and lower extremities. Good ROM, no contractures. Normal muscle tone.  Skin: no rashes, lesions, ulcers. No induration Neurologic: CN 2-12 grossly intact. Strength 5/5 in all 4.   Psychiatric: Normal judgment and insight. Alert and oriented x 3. Normal mood.   Labs on Admission: I have personally reviewed following labs and imaging studies  CBC: Recent Labs  Lab 05/27/19 1318  WBC 7.1  NEUTROABS 5.7  HGB 13.4  HCT 41.8  MCV 89.9  PLT 259*   Basic Metabolic Panel: Recent Labs  Lab 05/27/19 1318  NA 137  K 5.0  CL 104  CO2 24  GLUCOSE 109*  BUN 44*  CREATININE 2.45*  CALCIUM 9.0   Liver Function Tests: Recent Labs  Lab 05/27/19 1318  AST 12*  ALT 10  ALKPHOS 92  BILITOT 0.2*  PROT 7.0  ALBUMIN 3.9   CBG: Recent Labs  Lab 05/27/19 1322  GLUCAP 93    Radiological Exams on Admission: Ct Head Wo Contrast  Result Date: 05/27/2019 CLINICAL DATA:   Altered level of consciousness EXAM: CT HEAD WITHOUT CONTRAST CT CERVICAL SPINE WITHOUT CONTRAST TECHNIQUE: Multidetector CT imaging of the head and cervical spine was performed following the standard protocol without intravenous contrast. Multiplanar CT image reconstructions of the cervical spine were also generated. COMPARISON:  04/29/2019 FINDINGS: CT HEAD FINDINGS Brain: No evidence of acute infarction, hemorrhage, hydrocephalus, extra-axial collection or mass lesion/mass effect. Vascular: No hyperdense vessel or unexpected calcification. Skull: Normal. Negative for fracture or focal lesion. Sinuses/Orbits: No acute finding. Other: None. CT CERVICAL SPINE FINDINGS Alignment: Normal. Skull base and vertebrae: No acute fracture. No primary bone lesion or focal pathologic process. Soft tissues and spinal canal: No prevertebral fluid or swelling. No visible canal hematoma. Disc levels: Moderate disc degenerative and osteophytosis of the lower cervical spine. Upper  chest: Negative. Other: None. IMPRESSION: 1.  No acute intracranial pathology. 2.  No fracture or static subluxation of the cervical spine. Electronically Signed   By: Lauralyn Primes M.D.   On: 05/27/2019 13:23   Ct Cervical Spine Wo Contrast  Result Date: 05/27/2019 CLINICAL DATA:  Altered level of consciousness EXAM: CT HEAD WITHOUT CONTRAST CT CERVICAL SPINE WITHOUT CONTRAST TECHNIQUE: Multidetector CT imaging of the head and cervical spine was performed following the standard protocol without intravenous contrast. Multiplanar CT image reconstructions of the cervical spine were also generated. COMPARISON:  04/29/2019 FINDINGS: CT HEAD FINDINGS Brain: No evidence of acute infarction, hemorrhage, hydrocephalus, extra-axial collection or mass lesion/mass effect. Vascular: No hyperdense vessel or unexpected calcification. Skull: Normal. Negative for fracture or focal lesion. Sinuses/Orbits: No acute finding. Other: None. CT CERVICAL SPINE FINDINGS  Alignment: Normal. Skull base and vertebrae: No acute fracture. No primary bone lesion or focal pathologic process. Soft tissues and spinal canal: No prevertebral fluid or swelling. No visible canal hematoma. Disc levels: Moderate disc degenerative and osteophytosis of the lower cervical spine. Upper chest: Negative. Other: None. IMPRESSION: 1.  No acute intracranial pathology. 2.  No fracture or static subluxation of the cervical spine. Electronically Signed   By: Lauralyn Primes M.D.   On: 05/27/2019 13:23    EKG: Independently reviewed.  Junctional rhythm, 57.  QRS 125.  QTc 447.  Prior EKG shows sinus rhythm.  Assessment/Plan Active Problems:   Postural dizziness with presyncope   Postural dizziness with presyncope-with falls. Systolic blood pressure fluctuating from 85-120s, improved after 1L bolus.  Positive orthostatic vitals systolic lying 134 dropped to 797 standing.  Head and cervical CT negative for acute abnormality.  EKG shows new junctional rhythm rate 57.  Hs troponin 32.  Admission for same in April, cooperative at that time lisinopril HCTZ discontinued. -Monitor on telemetry -Repeat EKG in a.m. -No echo on file, will order. - N/s 100cc/hr x 15.  -May need to discontinue Norvasc on discharge  Hypertension- orthosthatic. - hold home norvasc, may need to d/c on discharge  Anxiety, history of seizures- cannot remember his antiseizure medications. -Resume home PRN Valium, amitriptyline, -Resume home gabapentin -Seizure precautions  Substance abuse- UDS positive for cocaine.  Also benzodiazepine but patient is on Valium.  Recent right hand surgery by Dr. Amanda Pea 05/09/19 for crushed hands and fingers following an accident.    DVT prophylaxis: Lovenox Code Status: FULL Family Communication: None at bedside Disposition Plan:  1- 2 days Consults called: None. Admission status: Obs, Tele   Onnie Boer MD [facility]  05/27/2019, 6:22 PM

## 2019-05-28 ENCOUNTER — Inpatient Hospital Stay (HOSPITAL_COMMUNITY): Payer: Medicaid - Out of State

## 2019-05-28 DIAGNOSIS — K219 Gastro-esophageal reflux disease without esophagitis: Secondary | ICD-10-CM | POA: Diagnosis present

## 2019-05-28 DIAGNOSIS — Z87820 Personal history of traumatic brain injury: Secondary | ICD-10-CM | POA: Diagnosis not present

## 2019-05-28 DIAGNOSIS — S01112A Laceration without foreign body of left eyelid and periocular area, initial encounter: Secondary | ICD-10-CM | POA: Diagnosis present

## 2019-05-28 DIAGNOSIS — I951 Orthostatic hypotension: Secondary | ICD-10-CM | POA: Diagnosis present

## 2019-05-28 DIAGNOSIS — W19XXXA Unspecified fall, initial encounter: Secondary | ICD-10-CM | POA: Diagnosis present

## 2019-05-28 DIAGNOSIS — D61818 Other pancytopenia: Secondary | ICD-10-CM | POA: Diagnosis present

## 2019-05-28 DIAGNOSIS — I495 Sick sinus syndrome: Secondary | ICD-10-CM | POA: Diagnosis present

## 2019-05-28 DIAGNOSIS — I498 Other specified cardiac arrhythmias: Secondary | ICD-10-CM | POA: Diagnosis not present

## 2019-05-28 DIAGNOSIS — N179 Acute kidney failure, unspecified: Secondary | ICD-10-CM | POA: Diagnosis present

## 2019-05-28 DIAGNOSIS — I129 Hypertensive chronic kidney disease with stage 1 through stage 4 chronic kidney disease, or unspecified chronic kidney disease: Secondary | ICD-10-CM | POA: Diagnosis present

## 2019-05-28 DIAGNOSIS — G4089 Other seizures: Secondary | ICD-10-CM | POA: Diagnosis present

## 2019-05-28 DIAGNOSIS — Z87891 Personal history of nicotine dependence: Secondary | ICD-10-CM | POA: Diagnosis not present

## 2019-05-28 DIAGNOSIS — R55 Syncope and collapse: Secondary | ICD-10-CM

## 2019-05-28 DIAGNOSIS — F419 Anxiety disorder, unspecified: Secondary | ICD-10-CM | POA: Diagnosis present

## 2019-05-28 DIAGNOSIS — Z79899 Other long term (current) drug therapy: Secondary | ICD-10-CM | POA: Diagnosis not present

## 2019-05-28 DIAGNOSIS — R001 Bradycardia, unspecified: Secondary | ICD-10-CM | POA: Diagnosis not present

## 2019-05-28 DIAGNOSIS — I471 Supraventricular tachycardia: Secondary | ICD-10-CM | POA: Diagnosis present

## 2019-05-28 DIAGNOSIS — F1410 Cocaine abuse, uncomplicated: Secondary | ICD-10-CM | POA: Diagnosis present

## 2019-05-28 DIAGNOSIS — U071 COVID-19: Secondary | ICD-10-CM | POA: Diagnosis present

## 2019-05-28 DIAGNOSIS — Z9181 History of falling: Secondary | ICD-10-CM | POA: Diagnosis not present

## 2019-05-28 DIAGNOSIS — N400 Enlarged prostate without lower urinary tract symptoms: Secondary | ICD-10-CM | POA: Diagnosis present

## 2019-05-28 DIAGNOSIS — N183 Chronic kidney disease, stage 3 (moderate): Secondary | ICD-10-CM | POA: Diagnosis present

## 2019-05-28 DIAGNOSIS — R42 Dizziness and giddiness: Secondary | ICD-10-CM | POA: Diagnosis not present

## 2019-05-28 LAB — ECHOCARDIOGRAM LIMITED
Height: 69 in
Weight: 2800 oz

## 2019-05-28 LAB — SARS CORONAVIRUS 2 (TAT 6-24 HRS): SARS Coronavirus 2: POSITIVE — AB

## 2019-05-28 MED ORDER — ENOXAPARIN SODIUM 40 MG/0.4ML ~~LOC~~ SOLN
40.0000 mg | SUBCUTANEOUS | Status: DC
  Administered 2019-05-28 – 2019-05-30 (×3): 40 mg via SUBCUTANEOUS
  Filled 2019-05-28 (×3): qty 0.4

## 2019-05-28 MED ORDER — HYDROCODONE-ACETAMINOPHEN 5-325 MG PO TABS
1.0000 | ORAL_TABLET | Freq: Four times a day (QID) | ORAL | Status: DC | PRN
  Administered 2019-05-28 – 2019-05-31 (×11): 1 via ORAL
  Filled 2019-05-28 (×11): qty 1

## 2019-05-28 NOTE — Progress Notes (Signed)
Report given to [place], Therapist, sports at [place]. [facility] called for transport.

## 2019-05-28 NOTE — Progress Notes (Signed)
Called by Dr Algis Liming for a consult on Mr Javier Rose, for bradycardia.   Pt put on census, to be seen by EP in am.   Rosaria Ferries, PA-C 05/28/2019 6:49 PM Bee[facility] [phone]

## 2019-05-28 NOTE — Plan of Care (Signed)
  Problem: Health Behavior/Discharge Planning: Goal: Ability to manage health-related needs will improve Outcome: Progressing   Problem: Safety: Goal: Ability to remain free from injury will improve Outcome: Progressing   

## 2019-05-28 NOTE — Progress Notes (Signed)
Infection Prevention  Call to [facility] RN at 11:40 am  re: COVID + patient found when reviewing AP isolation dashboard. Javier Rose aware of positive test at 07:30 am when notified by MD and transferred care of this patient to another nurse at that time. States patient is on airborne/contact.  IP Recommendation: observe airborne/contact level of PPE (n95, gown and gloves.) Patient is being transferred to [facility] per nursing due to positive COVID status.

## 2019-05-28 NOTE — Progress Notes (Signed)
Alert and well relate, Patient HR remains in Low 40's no sign of distress, ate dinner wo hesitation, requested bed time snack as well. Patient with complaint of pain RT finger, site of prior trauma. Finger is swollen with some erythema, large are of scabbing made aware pain medication  Q6hrs. Very anxious over medication, requesting insistently that he needed to be medicated for intense finger pain. Hydrocodone given at scheduled time, Patient encouraged to rest will continue to monitor.

## 2019-05-28 NOTE — Progress Notes (Signed)
2D limited echo completed.
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
## 2019-05-29 ENCOUNTER — Encounter (HOSPITAL_COMMUNITY): Payer: Self-pay | Admitting: Physician Assistant

## 2019-05-29 DIAGNOSIS — I495 Sick sinus syndrome: Secondary | ICD-10-CM

## 2019-05-29 DIAGNOSIS — N179 Acute kidney failure, unspecified: Secondary | ICD-10-CM

## 2019-05-29 DIAGNOSIS — N189 Chronic kidney disease, unspecified: Secondary | ICD-10-CM

## 2019-05-29 DIAGNOSIS — I498 Other specified cardiac arrhythmias: Secondary | ICD-10-CM

## 2019-05-29 DIAGNOSIS — I951 Orthostatic hypotension: Principal | ICD-10-CM

## 2019-05-29 LAB — CBC
HCT: 38.2 % — ABNORMAL LOW (ref 39.0–52.0)
Hemoglobin: 12.3 g/dL — ABNORMAL LOW (ref 13.0–17.0)
MCH: 28.8 pg (ref 26.0–34.0)
MCHC: 32.2 g/dL (ref 30.0–36.0)
MCV: 89.5 fL (ref 80.0–100.0)
Platelets: 148 10*3/uL — ABNORMAL LOW (ref 150–400)
RBC: 4.27 MIL/uL (ref 4.22–5.81)
RDW: 12.8 % (ref 11.5–15.5)
WBC: 4.4 10*3/uL (ref 4.0–10.5)
nRBC: 0 % (ref 0.0–0.2)

## 2019-05-29 LAB — BASIC METABOLIC PANEL
Anion gap: 7 (ref 5–15)
BUN: 33 mg/dL — ABNORMAL HIGH (ref 6–20)
CO2: 25 mmol/L (ref 22–32)
Calcium: 8.9 mg/dL (ref 8.9–10.3)
Chloride: 107 mmol/L (ref 98–111)
Creatinine, Ser: 2.19 mg/dL — ABNORMAL HIGH (ref 0.61–1.24)
GFR calc Af Amer: 37 mL/min — ABNORMAL LOW (ref >60)
GFR calc non Af Amer: 32 mL/min — ABNORMAL LOW (ref >60)
Glucose, Bld: 89 mg/dL (ref 70–99)
Potassium: 4.6 mmol/L (ref 3.5–5.1)
Sodium: 139 mmol/L (ref 135–145)

## 2019-05-29 MED ORDER — SODIUM CHLORIDE 0.9 % IV SOLN
INTRAVENOUS | Status: AC
  Administered 2019-05-29 – 2019-05-30 (×2): via INTRAVENOUS
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
## 2019-05-29 NOTE — Consult Note (Addendum)
Cardiology Consultation:   Patient ID: Javier Rose MRN: [id number]; DOB: 12-17-1960  Admit date: 05/27/2019 Date of Consult: 05/29/2019  Primary Care Provider: Gasper Sells, MD Primary Cardiologist: No primary care provider on file. Primary Electrophysiologist:  None    Patient Profile:   Javier Rose is a 58 y.o. 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 Waymon Amato.  History of Present Illness:   Javier Rose was hospitalized April this year with dizziness, felt to be 2/2 dehydration associated with GI illness/lossess, noted markedly orthostatic his lisinopril/chlorthalidone was stopped and he was started on amlodipine for his HTN.  Aug 2020 an ER fisit after a fall and c/o progressive generalized weakness for days, I do not see any clear findings, was discharged home with report of "multiple falls" there were no reports of syncope.  HR 50's mentioned, not his Tox was + for benzos and cocaine.  05/09/2019 he had a crush injury to his R middle finger required surgical intervention  05/27/2019 presented to [facility] after fall with reported seizure-like activity, possible head trauma.  He was found in a junctional rhythm HR 40s and in d/w Dr. Diona Browner recommended admission for further evaluation. H&P reports orthostatic changes were appreciated in the ER, systolic 134 dropping to 102, with BP as low as 85.  He did get a liter of IVF. He was admitted and being followed there, mentioning regainnng SR with intermittent junctional,  though came back COVID positive and transferred to [facility] He was also COCAINE and benzo positive (on home valium)  EP is asked to see regarding his bradycardia.  LABS K+ 5.0 > 4.6 BUN/Creat 44/2.45 >> 33/2.19 (baseline creat looks 1.4-1.6, was 2.4 last month) Mag 2.0 HS Trop 3,3 WBC 7.2 H/H 13/41 Plts 149  TOX: Positive for cocaine and benzos   COVID POSITIVE   Dr. Elberta Fortis has called in to the room and spoken with the patient He reports times that he is able to recognize his symptoms and is able to get to the floor or seated, other times he reports out of nowhere falls to the floor "I flop around on the floor like a fish out of water" he says he is awake with these and can not seem to stop it until it is over, this he reports are his seizures. He has noticed taking a spoonful of salt seems to help his low BP.  When his BP is high he will take his white BP pill.  Unfortunately sometimes he can not get his BP "balanced"  He feels like most of the time he can control his symptoms. He reports that he has fainted historically, but not usually.  He denies any CP, palpitations, or SOB  Heart Pathway Score:     Past Medical History:  Diagnosis Date  . GERD (gastroesophageal reflux disease)   . Hepatitis   . History of kidney stones   . Hypertension   . Seizures (HCC)   . TBI (traumatic brain injury) [facility])     Past Surgical History:  Procedure Laterality Date  . HERNIA REPAIR    . NERVE, TENDON AND ARTERY REPAIR Right 05/09/2019   Procedure: RIGHT HAND RECONSTRUCTION/MIDDLE AND RING FINGER;  Surgeon: Dominica Severin, MD;  Location: [facility] OR;  Service: Orthopedics;  Laterality: Right;     Home Medications:  Prior to Admission medications   Medication Sig Start Date End  Date Taking? Authorizing Provider  amitriptyline (ELAVIL) 100 MG tablet Take 100 mg by mouth at bedtime.   Yes [provider]  amLODipine (NORVASC) 10 MG tablet Take 1 tablet (10 mg total) by mouth daily. 12/18/18 12/18/19 Yes Vassie Loll, MD  diazepam (VALIUM) 10 MG tablet Take 10 mg by mouth every 6 (six) hours as needed for anxiety.   Yes [provider]  esomeprazole (NEXIUM) 20 MG capsule Take 20 mg by mouth daily at 12 noon.   Yes [provider]  gabapentin (NEURONTIN) 600 MG tablet Take 1,200 mg by mouth at bedtime.   Yes [provider]  HYDROcodone-acetaminophen (NORCO/VICODIN) 5-325 MG tablet Take 1-2 tablets by mouth every 4 (four) hours as needed for moderate pain (pain score 4-6). 05/10/19  Yes Dominica Severin, MD    Inpatient Medications: Scheduled Meds: . amitriptyline  100 mg Oral QHS  . enoxaparin (LOVENOX) injection  40 mg Subcutaneous Q24H  . gabapentin  1,200 mg Oral QHS  . pantoprazole  40 mg Oral Daily   Continuous Infusions:  PRN Meds: acetaminophen **OR** acetaminophen, alum & mag hydroxide-simeth, diazepam, HYDROcodone-acetaminophen, ondansetron **OR** ondansetron (ZOFRAN) IV, polyethylene glycol  Allergies:   No Known Allergies  Social History:   Social History   Socioeconomic History  . Marital status: Single    Spouse name: Not on file  . Number of children: 1  . Years of education: Not on file  . Highest education level: Not on file  Occupational History  . Not on file  Social Needs  . Financial resource strain: Not hard at all  . Food insecurity    Worry: Patient refused    Inability: Patient refused  . Transportation needs    Medical: No    Non-medical: No  Tobacco Use  . Smoking status: Former Smoker    Packs/day: 1.00    Types: Cigarettes    Quit date: 11/16/2018    Years since quitting: 0.5  . Smokeless tobacco: Never Used  Substance and Sexual Activity  . Alcohol use: Never    Frequency: Never  . Drug use: Not Currently  . Sexual activity: Not on file  Lifestyle  . Physical activity    Days per week: 0 days    Minutes per session: 0 min  . Stress: Not at all  Relationships  . Social Musician on phone: Patient refused    Gets together: Patient refused    Attends religious service: Patient refused    Active member of club or organization: Patient refused    Attends meetings of clubs or organizations: Patient refused    Relationship status: Patient refused  . Intimate partner violence    Fear of current or ex partner: No    Emotionally  abused: No    Physically abused: No    Forced sexual activity: No  Other Topics Concern  . Not on file  Social History Narrative  . Not on file    Family History:   Family History  Problem Relation Age of Onset  . Cancer Father        brain  . Diabetes Sister   . Hypertension Sister      ROS:  Please see the history of present illness.  All other ROS reviewed and negative.     Physical Exam/Data:   Vitals:   05/28/19 2200 05/28/19 2300 05/29/19 0300 05/29/19 0710  BP:  (!) 129/59 135/63 (!) 108/52  Pulse:    Marland Kitchen)  44  Resp: 18   15  Temp:   97.8 F (36.6 C) 98.1 F (36.7 C)  TempSrc:   Oral Oral  SpO2:    100%  Weight:      Height:        Intake/Output Summary (Last 24 hours) at 05/29/2019 1427 Last data filed at 05/29/2019 1200 Gross per 24 hour  Intake 820 ml  Output 1300 ml  Net -480 ml   Last 3 Weights 05/27/2019 05/10/2019 05/09/2019  Weight (lbs) 175 lb 180 lb 1.6 oz 174 lb  Weight (kg) 79.379 kg 81.693 kg 78.926 kg     Body mass index is 25.84 kg/m.  The visit was completed via telephone given COVID + status General:  Voice sounds strong, speaking in full sentences without difficulty Lungs:  Does not sound SOB, speaks in full sentences without difficulty Psych:  Pleasant, very talkative      EKG:  The EKG was personally reviewed and demonstrates:    Junctional rhythm, IVCD, QRS 177ms, 57bpm Junctional rhythm, 40bpm, , QRS 174ms,  Junctional  With some sinus beats 51bpm, PAC  05/09/2019 SB 53bpm, IVCD, QRS 166ms  Telemetry:  Telemetry was personally reviewed and demonstrates:    SB 40's- junctional tachycardia 70's  Relevant CV Studies:  05/09/2019: TTE IMPRESSIONS  1. Left ventricular ejection fraction, by visual estimation, is 60 to 65%. The left ventricle has normal function. Normal left ventricular size. There is no left ventricular hypertrophy.  2. Global right ventricle has normal systolic function.The right ventricular size is normal. No  increase in right ventricular wall thickness.  3. Left atrial size was normal.  4. Right atrial size was normal.  5. The mitral valve is grossly normal. Trace mitral valve regurgitation.  6. The tricuspid valve is grossly normal. Tricuspid valve regurgitation is trivial.  7. The aortic valve is normal in structure. Aortic valve regurgitation was not visualized by color flow Doppler.  8. The pulmonic valve was grossly normal. Pulmonic valve regurgitation is not visualized by color flow Doppler.  9. Mildly elevated pulmonary artery systolic pressure. 10. The tricuspid regurgitant velocity is 2.58 m/s, and with an assumed right atrial pressure of 10 mmHg, the estimated right ventricular systolic pressure is mildly elevated at 36.6 mmHg. 11. The inferior vena cava is normal in size with greater than 50% respiratory variability, suggesting right atrial pressure of 3 mmHg.  In comparison to the previous echocardiogram(s): There are no prior studies on this patient for comparison purposes.  Laboratory Data:  High Sensitivity Troponin:   Recent Labs  Lab 04/29/19 1602 05/27/19 1318 05/27/19 1524  TROPONINIHS 6 3 3      Chemistry Recent Labs  Lab 05/27/19 1318 05/29/19 0723  NA 137 139  K 5.0 4.6  CL 104 107  CO2 24 25  GLUCOSE 109* 89  BUN 44* 33*  CREATININE 2.45* 2.19*  CALCIUM 9.0 8.9  GFRNONAA 28* 32*  GFRAA 32* 37*  ANIONGAP 9 7    Recent Labs  Lab 05/27/19 1318  PROT 7.0  ALBUMIN 3.9  AST 12*  ALT 10  ALKPHOS 92  BILITOT 0.2*   Hematology Recent Labs  Lab 05/27/19 1318 05/29/19 0723  WBC 7.1 4.4  RBC 4.65 4.27  HGB 13.4 12.3*  HCT 41.8 38.2*  MCV 89.9 89.5  MCH 28.8 28.8  MCHC 32.1 32.2  RDW 13.0 12.8  PLT 149* 148*   BNPNo results for input(s): BNP, PROBNP in the last 168 hours.  DDimer No results  for input(s): DDIMER in the last 168 hours.   Radiology/Studies:   Ct Head Wo Contrast Result Date: 05/27/2019 CLINICAL DATA:  Altered level of  consciousness EXAM: CT HEAD WITHOUT CONTRAST CT CERVICAL SPINE WITHOUT CONTRAST TECHNIQUE: Multidetector CT imaging of the head and cervical spine was performed following the standard protocol without intravenous contrast. Multiplanar CT image reconstructions of the cervical spine were also generated. COMPARISON:  04/29/2019 FINDINGS: CT HEAD FINDINGS Brain: No evidence of acute infarction, hemorrhage, hydrocephalus, extra-axial collection or mass lesion/mass effect. Vascular: No hyperdense vessel or unexpected calcification. Skull: Normal. Negative for fracture or focal lesion. Sinuses/Orbits: No acute finding. Other: None. CT CERVICAL SPINE FINDINGS Alignment: Normal. Skull base and vertebrae: No acute fracture. No primary bone lesion or focal pathologic process. Soft tissues and spinal canal: No prevertebral fluid or swelling. No visible canal hematoma. Disc levels: Moderate disc degenerative and osteophytosis of the lower cervical spine. Upper chest: Negative. Other: None. IMPRESSION: 1.  No acute intracranial pathology. 2.  No fracture or static subluxation of the cervical spine. Electronically Signed   By: Lauralyn PrimesAlex  Bibbey M.D.   On: 05/27/2019 13:23      Assessment and Plan:   1. Marked and known h/o orthostatic dizziness 2. AKI     Recommend continued fluid replacement     Avoid vasodilators for HTN management , would not resume amlodipine     Unknown how high his BP is, but would allow some degree of HTN for this patient  3. Junctional bradycardia     Appears component of autonomic dysfunction     He may need pacing, though not currently     He was advised no driving for 6 mo with reports of seizures and syncope      EP follow up will be arranged, the patient is living in [place] and prefers to have f/u in [place]  4. Cocaine abuse     Patient denies and is certain his + result is a mistake     He was counselled            For questions or updates, please contact [facility] Please  consult [email or web address] for contact info under     Signed, Sheilah PigeonRenee Lynn Ursuy, PA-C  05/29/2019 2:27 PM  Orhtostatic hypotension profound With evidence of autonomic failure as there is no HR response  Hypertension   Sinus node dysfunction and junctional rhythm  TBI  Seizures  COVID   The patient has profound orthostatic hypotension today with 110 mm drop in systolic blood pressure immediately upon standing.  It is notable for the absence of a heart rate response, this suggesting autonomic failure as opposed to dysautonomia.  He also has sinus node dysfunction with bradycardia noted 4/20 and more profound bradycardia noted 8/20.  His heart rates in the 50s are quite similar to the rates in August.  Hence, I do not think that there is indication acutely for pacing.  An  explanation of his sinus node dysfunction however is begged.  Amyloid for example could cause sinus node dysfunction as well as autonomic insufficiency.  Further imaging, specifically MRI, would be helpful.  This could all be done post COVID.  Therapeutic interventions to try to mitigate his risk of falls related to his orthostatic hypotension.  These could include Midrin, pyridostigmine, abdominal binders and or thigh sleeves.  When I raised these options with him, he was quite adamant that he did not want to do anything.  Further, he is not at all  interested in a pacemaker.  Which is good because he has no indication for pacing.```````     Call for further questions   Thank s

## 2019-05-29 NOTE — Progress Notes (Signed)
HR 36-50, MD aware, EKG as per order with Sinus Bradycardia, asymptomatic, resting comfaortably.

## 2019-05-29 NOTE — Progress Notes (Signed)
PROGRESS NOTE   Javier Rose  IRW:[id number]    DOB: 30-Aug-1961    DOA: 05/27/2019  PCP: Tommye Standard, MD   I have briefly reviewed patients previous medical records in [facility].  Chief Complaint  Patient presents with  . Seizures    Brief Narrative:  [facility] transfer to [facility] 61/23: 58 year old male with PMH of HTN, BPH, TBI, seizures, hepatitis, GERD, presented to ED with multiple episodes of lightheadedness resulting in 2 falls,?  Seizure-like activity, head injury and a small laceration to his left eyebrow after the first fall.  Hospitalized in April 2020 for dizziness felt to be due to dehydration from GI losses, noted markedly orthostatic, lisinopril/chlorthalidone were discontinued and discharged on amlodipine for HTN.  Early September, had crush injury to right middle finger requiring surgical intervention.  Noted junctional rhythm with HR in the 40s, significant orthostatic hypotension, COVID positive, cardiologist there advised transferred to [facility] for evaluation by EP cardiology.  EP cardiology input appreciated.  No pacemaker indicated at this time.   Assessment & Plan:   Active Problems:   Postural dizziness with presyncope   Orthostatic hypotension/Possible PreSyncope  Recurrent issue even during prior hospital admissions.  Troponins x2: Negative.  Head and cervical spine CT without acute abnormalities.  TTE: LVEF 60-65%.  Aortic valve normal.  Telemetry shows a low  BP today dropped from 174/74 supine to 68/51 standing without heart rate response.  EP cardiology input appreciated, feel that this is autonomic failure.  Cardiology discussed multiple interventions to improve his orthostatic hypotension and decrease fall risk including medications i.e. midodrine, pyridostigmine; abdominal binders and thigh-high compression stockings but patient declined all of these.  We will hydrate with IV fluids overnight but not sure if this is going to make much of a difference.   Avoid amlodipine at discharge.  No driving for 6 months, patient counseled.  Bradycardia due to sinus node dysfunction  EP cardiology input appreciated and do not think there is an indication for pacemaker.  Moreover patient is not interested in a pacemaker.  They indicated that amyloid could cause sinus node dysfunction and autonomic insufficiency which could be evaluated by cardiac MRI that can be done post COVID as outpatient.  Acute kidney injury complicating Stage III chronic kidney disease.  Creatinine in April: 1.43, August 2.43, now presented with creatinine of 2.45.  Etiology of acute kidney injury not clear.  Also not sure if he now has a higher baseline creatinine.  Improved with IV fluids to 2.19.  Continue gentle IV fluids for additional 24 hours and follow BMP in a.m.  COVID positive  Stable and asymptomatic.  Not hypoxemic.  Continue contact and airborne precautions.  Essential hypertension  Currently controlled.  And as indicated by cardiology, may need to allow blood pressures to run high.  No amlodipine at discharge.  Anxiety disorder/history of seizures  Patient unable to recollect his ADD medications.  Resumed home as needed Valium and amitriptyline.  Continue gabapentin.  Thrombocytopenia  Unclear etiology.  Mild and stable.  Substance abuse/cocaine  UDS positive for benzodiazepines (patient on Valium) and cocaine.  BAL <10.  S/P Right middle finger surgery 9/5   By Dr. Amedeo Plenty for crush wound  Appears to be healing well and scabbed.  No features suggestive of acute infection.  Patient asking for more pain medications, will not escalate at this time.  He does not truly appear to be in discomfort.    DVT prophylaxis: Lovenox Code Status: Full Family Communication: None  at bedside Disposition: DC home pending clinical improvement.   Consultants:  EP Cardiology  Procedures:  None  Antimicrobials:  None   Subjective: Patient  reports ongoing intermittent postural dizziness but no headache, vertigo or passing out.  No chest pain, palpitations or dyspnea.  No fever or chills.  Reports ongoing right middle finger pain at site of surgery.  Objective:  Vitals:   05/28/19 2200 05/28/19 2300 05/29/19 0300 05/29/19 0710  BP:  (!) 129/59 135/63 (!) 108/52  Pulse:    (!) 44  Resp: 18   15  Temp:   97.8 F (36.6 C) 98.1 F (36.7 C)  TempSrc:   Oral Oral  SpO2:    100%  Weight:      Height:        Examination:  General exam: Pleasant young male, moderately built and nourished sitting up comfortably at edge of bed. Respiratory system: Clear to auscultation. Respiratory effort normal. Cardiovascular system: S1 & S2 heard, RRR. No JVD, murmurs, rubs, gallops or clicks. No pedal edema.  Telemetry personally reviewed: Mostly sinus bradycardia in the 40s, at times in the 30s with junctional tachycardia possibly related to activity up to 70s. Gastrointestinal system: Abdomen is nondistended, soft and nontender. No organomegaly or masses felt. Normal bowel sounds heard. Central nervous system: Alert and oriented. No focal neurological deficits. Extremities: Symmetric 5 x 5 power.  Right middle finger distal phalanx surgical site wound is scabbed, minimal peri-scab faint redness but no drainage or tenderness. Skin: No rashes, lesions or ulcers Psychiatry: Judgement and insight appear normal. Mood & affect appropriate.     Data Reviewed: I have personally reviewed following labs and imaging studies  CBC: Recent Labs  Lab 05/27/19 1318 05/29/19 0723  WBC 7.1 4.4  NEUTROABS 5.7  --   HGB 13.4 12.3*  HCT 41.8 38.2*  MCV 89.9 89.5  PLT 149* 148*   Basic Metabolic Panel: Recent Labs  Lab 05/27/19 1318 05/29/19 0723  NA 137 139  K 5.0 4.6  CL 104 107  CO2 24 25  GLUCOSE 109* 89  BUN 44* 33*  CREATININE 2.45* 2.19*  CALCIUM 9.0 8.9  MG 2.0  --    Liver Function Tests: Recent Labs  Lab 05/27/19 1318  AST  12*  ALT 10  ALKPHOS 92  BILITOT 0.2*  PROT 7.0  ALBUMIN 3.9    Cardiac Enzymes: No results for input(s): CKTOTAL, CKMB, CKMBINDEX, TROPONINI in the last 168 hours.  CBG: Recent Labs  Lab 05/27/19 1322  GLUCAP 93    Recent Results (from the past 240 hour(s))  SARS CORONAVIRUS 2 (TAT 6-24 HRS) Nasopharyngeal Nasopharyngeal Swab     Status: Abnormal   Collection Time: 05/27/19  4:25 PM   Specimen: Nasopharyngeal Swab  Result Value Ref Range Status   SARS Coronavirus 2 POSITIVE (A) NEGATIVE Final    Comment: (NOTE) SARS-CoV-2 target nucleic acids are DETECTED. The SARS-CoV-2 RNA is generally detectable in upper and lower respiratory specimens during the acute phase of infection. Positive results are indicative of active infection with SARS-CoV-2. Clinical  correlation with patient history and other diagnostic information is necessary to determine patient infection status. Positive results do  not rule out bacterial infection or co-infection with other viruses. The expected result is Negative. Fact Sheet for Patients: HairSlick.nohttps://www.fda.gov/media/138098/download Fact Sheet for Healthcare Providers: quierodirigir.comhttps://www.fda.gov/media/138095/download This test is not yet approved or cleared by the [place] FDA and  has been authorized for detection and/or diagnosis of SARS-CoV-2 by FDA under  an Emergency Use Authorization (EUA). This EUA will remain  in effect (meaning this test can be used) for the duration of the COVID-19 declaration under Section 564(b)(1) of the Act, 21 U.S.C.  section 360bbb-3(b)(1), unless the authorization is terminated or revoked sooner. Performed at [facility], [address], [place], [place] [address]          Radiology Studies: No results found.      Scheduled Meds: . amitriptyline  100 mg Oral QHS  . enoxaparin (LOVENOX) injection  40 mg Subcutaneous Q24H  . gabapentin  1,200 mg Oral QHS  . pantoprazole  40 mg Oral Daily    Continuous Infusions:   LOS: 1 day     Marcellus Scott, MD, FACP, [facility]. [facility]  To contact the attending provider between 7A-7P or the covering provider during after hours 7P-7A, please log into the web site [email or web address] and access using universal [place] password for that web site. If you do not have the password, please call the hospital operator.  05/29/2019, 4:09 PM

## 2019-05-30 ENCOUNTER — Other Ambulatory Visit: Payer: Self-pay

## 2019-05-30 DIAGNOSIS — R001 Bradycardia, unspecified: Secondary | ICD-10-CM

## 2019-05-30 LAB — CBC
HCT: 35.9 % — ABNORMAL LOW (ref 39.0–52.0)
Hemoglobin: 11.8 g/dL — ABNORMAL LOW (ref 13.0–17.0)
MCH: 28.9 pg (ref 26.0–34.0)
MCHC: 32.9 g/dL (ref 30.0–36.0)
MCV: 88 fL (ref 80.0–100.0)
Platelets: 140 10*3/uL — ABNORMAL LOW (ref 150–400)
RBC: 4.08 MIL/uL — ABNORMAL LOW (ref 4.22–5.81)
RDW: 12.6 % (ref 11.5–15.5)
WBC: 3.2 10*3/uL — ABNORMAL LOW (ref 4.0–10.5)
nRBC: 0 % (ref 0.0–0.2)

## 2019-05-30 LAB — BASIC METABOLIC PANEL
Anion gap: 7 (ref 5–15)
BUN: 32 mg/dL — ABNORMAL HIGH (ref 6–20)
CO2: 24 mmol/L (ref 22–32)
Calcium: 8.8 mg/dL — ABNORMAL LOW (ref 8.9–10.3)
Chloride: 108 mmol/L (ref 98–111)
Creatinine, Ser: 1.91 mg/dL — ABNORMAL HIGH (ref 0.61–1.24)
GFR calc Af Amer: 43 mL/min — ABNORMAL LOW (ref >60)
GFR calc non Af Amer: 38 mL/min — ABNORMAL LOW (ref >60)
Glucose, Bld: 103 mg/dL — ABNORMAL HIGH (ref 70–99)
Potassium: 4.3 mmol/L (ref 3.5–5.1)
Sodium: 139 mmol/L (ref 135–145)

## 2019-05-30 MED ORDER — SODIUM CHLORIDE 0.9 % IV SOLN
INTRAVENOUS | Status: AC
  Administered 2019-05-30 (×2): via INTRAVENOUS

## 2019-05-30 MED ORDER — SODIUM CHLORIDE 0.9 % IV SOLN
INTRAVENOUS | Status: DC
  Administered 2019-05-30: 11:00:00 via INTRAVENOUS

## 2019-05-30 NOTE — Progress Notes (Signed)
Pt calm and cooperative. Pt apologized for behavior before and was joking around with this staff member about drinking moonshine. Fine tremor noted in left hand not noted during earlier assessment. Pt given Xanax 10mg  with HS meds. Pt states I take it every 8 hours at home. I explained here he needed to ask for it because it isn't scheduled. I gave pt the rest of his meds.

## 2019-05-30 NOTE — Progress Notes (Signed)
PROGRESS NOTE   Javier Rose  HEN:[id number]    DOB: 1961/07/09    DOA: 05/27/2019  PCP: Gasper Sells, MD   I have briefly reviewed patients previous medical records in [facility].  Chief Complaint  Patient presents with  . Seizures    Brief Narrative:  [facility] transfer to [facility] 2/85: 58 year old male with PMH of HTN, BPH, TBI, seizures, hepatitis, GERD, presented to ED with multiple episodes of lightheadedness resulting in 2 falls,?  Seizure-like activity, head injury and a small laceration to his left eyebrow after the first fall.  Hospitalized in April 2020 for dizziness felt to be due to dehydration from GI losses, noted markedly orthostatic, lisinopril/chlorthalidone were discontinued and discharged on amlodipine for HTN.  Early September, had crush injury to right middle finger requiring surgical intervention.  Noted junctional rhythm with HR in the 40s, significant orthostatic hypotension, COVID positive, cardiologist there advised transferred to [facility] for evaluation by EP cardiology.  EP cardiology input appreciated.  No pacemaker indicated at this time.   Assessment & Plan:   Active Problems:   Postural dizziness with presyncope   Orthostatic hypotension/Possible PreSyncope  Recurrent issue even during prior hospital admissions.  Troponins x2: Negative.  Head and cervical spine CT without acute abnormalities.  TTE: LVEF 60-65%.  Aortic valve normal.  Telemetry shows SB/junctional tachycardia  Orthostatic blood pressures 9/25 dropped from 174/74 supine to 68/51 standing without heart rate response.  Better today 141/61 > 109/70 without appropriate heart rate response (40 > 37).  EP cardiology input appreciated, feel that this is autonomic failure.  Cardiology discussed multiple interventions to improve his orthostatic hypotension and decrease fall risk including medications i.e. midodrine, pyridostigmine; abdominal binders and thigh-high compression stockings but patient  declined all of these.  Patient today willing to try lower extremity compression hoses.  May be IV fluids did not make a difference, continue IV fluids for additional 24 hours.  Avoid amlodipine at discharge.  No driving for 6 months, patient counseled.  Bradycardia due to sinus node dysfunction  EP cardiology input appreciated and do not think there is an indication for pacemaker.  Moreover patient is not interested in a pacemaker.  They indicated that amyloid could cause sinus node dysfunction and autonomic insufficiency which could be evaluated by cardiac MRI that can be done post COVID as outpatient.  Ongoing sinus bradycardia up to 35-40 overnight with some 2.04-second pauses but junctional rhythm in the 60s-70s this morning.  Acute kidney injury complicating Stage III chronic kidney disease.  Creatinine in April: 1.43, August: 2.43, now presented with creatinine of 2.45.  Etiology of acute kidney injury not clear.  Also not sure if he now has a higher baseline creatinine.  After IV fluids, creatinine is improving, 2.19 > 1.91.  Continue IV fluids for additional 24 hours and follow BMP.  COVID positive  Stable and asymptomatic.  Not hypoxemic.  Continue contact and airborne precautions.  Essential hypertension  Currently controlled.  And as indicated by cardiology, may need to allow blood pressures to run high.  No amlodipine at discharge.  Anxiety disorder/history of seizures  Patient unable to recollect his ADD medications.  Resumed home as needed Valium and amitriptyline.  Continue gabapentin.  Thrombocytopenia  Unclear etiology.  Mild and stable.  Has mild pancytopenia.  Follow CBC.  Substance abuse/cocaine  UDS positive for benzodiazepines (patient on Valium) and cocaine.  BAL <10.  S/P Right middle finger surgery 9/5   By Dr. Amanda Pea for crush wound  Appears to be healing well and scabbed.  No features suggestive of acute infection.  Patient asking  for more pain medications, will not escalate at this time.  He does not truly appear to be in discomfort.  Outpatient follow-up with hand surgery.    DVT prophylaxis: Lovenox Code Status: Full Family Communication: None at bedside Disposition: DC home pending clinical improvement, cardiology clearance,?  9/27.   Consultants:  EP Cardiology  Procedures:  None  Antimicrobials:  None   Subjective: Frustrated because he has not been getting his meals in timely manner.  No dizziness reported.  Objective:  Vitals:   05/29/19 1800 05/29/19 1945 05/29/19 1955 05/29/19 2324  BP: (!) 147/74  (!) 165/70 (!) 165/84  Pulse: (!) 55  (!) 55 (!) 50  Resp: 17  18 18   Temp: 98.8 F (37.1 C) 98.3 F (36.8 C)  98 F (36.7 C)  TempSrc: Oral Oral  Oral  SpO2: 97%  100% 98%  Weight:      Height:        Examination:  General exam: Pleasant young male, moderately built and nourished sitting up comfortably at edge of bed. Respiratory system: Clear to auscultation.  No increased work of breathing. Cardiovascular system: S1 and S2 heard, regular bradycardic.  No JVD or murmurs.  Telemetry personally reviewed: Sinus bradycardia up to 35-40s overnight and occasional pause of 2.04 seconds.  Junctional rhythm in the 60s-70s this morning. Gastrointestinal system: Abdomen is nondistended, soft and nontender. No organomegaly or masses felt. Normal bowel sounds heard. Central nervous system: Alert and oriented. No focal neurological deficits. Extremities: Symmetric 5 x 5 power.  Right middle finger distal phalanx surgical site wound is scabbed, minimal peri-scab faint redness but no drainage or tenderness. Skin: No rashes, lesions or ulcers Psychiatry: Judgement and insight appear normal. Mood & affect appropriate.     Data Reviewed: I have personally reviewed following labs and imaging studies  CBC: Recent Labs  Lab 05/27/19 1318 05/29/19 0723 05/30/19 0514  WBC 7.1 4.4 3.2*  NEUTROABS  5.7  --   --   HGB 13.4 12.3* 11.8*  HCT 41.8 38.2* 35.9*  MCV 89.9 89.5 88.0  PLT 149* 148* 237*   Basic Metabolic Panel: Recent Labs  Lab 05/27/19 1318 05/29/19 0723 05/30/19 0514  NA 137 139 139  K 5.0 4.6 4.3  CL 104 107 108  CO2 24 25 24   GLUCOSE 109* 89 103*  BUN 44* 33* 32*  CREATININE 2.45* 2.19* 1.91*  CALCIUM 9.0 8.9 8.8*  MG 2.0  --   --    Liver Function Tests: Recent Labs  Lab 05/27/19 1318  AST 12*  ALT 10  ALKPHOS 92  BILITOT 0.2*  PROT 7.0  ALBUMIN 3.9    CBG: Recent Labs  Lab 05/27/19 1322  GLUCAP 93    Recent Results (from the past 240 hour(s))  SARS CORONAVIRUS 2 (TAT 6-24 HRS) Nasopharyngeal Nasopharyngeal Swab     Status: Abnormal   Collection Time: 05/27/19  4:25 PM   Specimen: Nasopharyngeal Swab  Result Value Ref Range Status   SARS Coronavirus 2 POSITIVE (A) NEGATIVE Final    Comment: (NOTE) SARS-CoV-2 target nucleic acids are DETECTED. The SARS-CoV-2 RNA is generally detectable in upper and lower respiratory specimens during the acute phase of infection. Positive results are indicative of active infection with SARS-CoV-2. Clinical  correlation with patient history and other diagnostic information is necessary to determine patient infection status. Positive results do  not rule out  bacterial infection or co-infection with other viruses. The expected result is Negative. Fact Sheet for Patients: HairSlick.nohttps://www.fda.gov/media/138098/download Fact Sheet for Healthcare Providers: quierodirigir.comhttps://www.fda.gov/media/138095/download This test is not yet approved or cleared by the [place] FDA and  has been authorized for detection and/or diagnosis of SARS-CoV-2 by FDA under an Emergency Use Authorization (EUA). This EUA will remain  in effect (meaning this test can be used) for the duration of the COVID-19 declaration under Section 564(b)(1) of the Act, 21 U.S.C.  section 360bbb-3(b)(1), unless the authorization is terminated or revoked  sooner. Performed at [facility], [address] [place], [place] [address]          Radiology Studies: No results found.      Scheduled Meds: . amitriptyline  100 mg Oral QHS  . enoxaparin (LOVENOX) injection  40 mg Subcutaneous Q24H  . gabapentin  1,200 mg Oral QHS  . pantoprazole  40 mg Oral Daily   Continuous Infusions: . sodium chloride 100 mL/hr at 05/30/19 1111     LOS: 2 days     Marcellus ScottAnand Arryn Terrones, MD, FACP, [facility]. [facility]  To contact the attending provider between 7A-7P or the covering provider during after hours 7P-7A, please log into the web site [email or web address] and access using universal [place] password for that web site. If you do not have the password, please call the hospital operator.  05/30/2019, 3:46 PM

## 2019-05-30 NOTE — Progress Notes (Signed)
Have been in rm multiple times since change of shift for same things (his right middle finger that was injured and the fact that it is 2000 and well past dinner time). Pt yelling at times that he is hungry and hasn't ate since noon. Explained dietary is working as fast as they can and their are other pts that ordered late on this floor and other floors plus new admits. Pt yelled this is 2 hours past dinner time and unacceptable. Again I apologized and told pt as soon as it comes to the floor we will bring it to him or I could get him a Scientist, water quality. Pt declined the [employer] but stated if we had ice cream he'd take 2 and some ginger ale. I stated we did and would get it for him. Pt then started yelling about his right middle finger that had been hurt and how red it was. I stated from report the doctor looked at it and stated it was ok without signs of infection. The laceration remains deep with reddened borders. Skin is very dry and peeling off around the site. Pt stated I keep washing it with soap and water and using those green packaged wipes (CHG). Instructed the pt to quit using the wipes and washing his finger so much because he's going to end up breaking the skin from it getting so dry.

## 2019-05-31 LAB — CBC
HCT: 33.3 % — ABNORMAL LOW (ref 39.0–52.0)
Hemoglobin: 10.6 g/dL — ABNORMAL LOW (ref 13.0–17.0)
MCH: 29.3 pg (ref 26.0–34.0)
MCHC: 31.8 g/dL (ref 30.0–36.0)
MCV: 92 fL (ref 80.0–100.0)
Platelets: 131 10*3/uL — ABNORMAL LOW (ref 150–400)
RBC: 3.62 MIL/uL — ABNORMAL LOW (ref 4.22–5.81)
RDW: 12.7 % (ref 11.5–15.5)
WBC: 3.4 10*3/uL — ABNORMAL LOW (ref 4.0–10.5)
nRBC: 0 % (ref 0.0–0.2)

## 2019-05-31 LAB — BASIC METABOLIC PANEL
Anion gap: 7 (ref 5–15)
BUN: 26 mg/dL — ABNORMAL HIGH (ref 6–20)
CO2: 24 mmol/L (ref 22–32)
Calcium: 8.7 mg/dL — ABNORMAL LOW (ref 8.9–10.3)
Chloride: 111 mmol/L (ref 98–111)
Creatinine, Ser: 1.68 mg/dL — ABNORMAL HIGH (ref 0.61–1.24)
GFR calc Af Amer: 51 mL/min — ABNORMAL LOW (ref >60)
GFR calc non Af Amer: 44 mL/min — ABNORMAL LOW (ref >60)
Glucose, Bld: 154 mg/dL — ABNORMAL HIGH (ref 70–99)
Potassium: 4.2 mmol/L (ref 3.5–5.1)
Sodium: 142 mmol/L (ref 135–145)

## 2019-05-31 MED ORDER — MIDODRINE HCL 5 MG PO TABS
5.0000 mg | ORAL_TABLET | Freq: Three times a day (TID) | ORAL | Status: DC
  Administered 2019-05-31: 5 mg via ORAL
  Filled 2019-05-31: qty 1

## 2019-05-31 MED ORDER — MIDODRINE HCL 5 MG PO TABS: 5.0000 mg | ORAL_TABLET | Freq: Three times a day (TID) | ORAL | 0 refills | Status: DC

## 2019-05-31 MED ORDER — GABAPENTIN 600 MG PO TABS: 1200.0000 mg | ORAL_TABLET | Freq: Every day | ORAL | 0 refills | Status: DC

## 2019-05-31 NOTE — Progress Notes (Signed)
Pt stated earlier tonight when the doctor started talking about a Pacemaker he told the doctor to get out of his rm because he was too young for a Pacemaker

## 2019-05-31 NOTE — Discharge Summary (Signed)
Physician Discharge Summary  Steffon Gladu WUJ:[id number] DOB: 03-13-1961  PCP: Gasper Sells, MD  Admitted from: Home Discharged to: Home  Admit date: 05/27/2019 Discharge date: 05/31/2019  Recommendations for Outpatient Follow-up:   Follow-up Information    Hillis Range, MD Follow up.   Specialty: Cardiology Why: 07/10/2019 @ 8:45AM Contact information: [address] [place] [address] [phone]        Gasper Sells, MD. Schedule an appointment as soon as possible for a visit in 1 week(s).   Specialty: Family Medicine Why: To be seen with repeat labs (CBC & BMP). Contact information: [address] [place] [address] [phone]        Myrene Galas, MD. Schedule an appointment as soon as possible for a visit.   Specialty: Orthopedic Surgery Why: Post op follow up after Right middle finger surgery. Contact information: [address] [place] [address] [phone]            Home Health: None Equipment/Devices: None  Discharge Condition: Improved and stable CODE STATUS: Full Diet recommendation: Heart healthy diet.  Discharge Diagnoses:  Active Problems:   Postural dizziness with presyncope   Brief Summary: [facility] transfer to [facility] 77/75: 58 year old male with PMH of HTN, BPH, TBI, seizures, hepatitis, GERD, presented to ED with multiple episodes of lightheadedness resulting in 2 falls,?  Seizure-like activity, head injury and a small laceration to his left eyebrow after the first fall.  Hospitalized in April 2020 for dizziness felt to be due to dehydration from GI losses, noted markedly orthostatic, lisinopril/chlorthalidone were discontinued and discharged on amlodipine for HTN.  Early September, had crush injury to right middle finger requiring surgical intervention.  Noted junctional rhythm with HR in the 40s, significant orthostatic hypotension, COVID positive, Cardiologist at [facility] advised transfer to [facility] for evaluation by EP Ccardiology.   EP Cardiology input appreciated.  No pacemaker indicated at this time.   Assessment & Plan:  Orthostatic hypotension/Possible PreSyncope  Recurrent issue even during prior hospital admissions.  Troponins x2: Negative.  Head and cervical spine CT without acute abnormalities.  TTE: LVEF 60-65%.  Aortic valve normal.  Telemetry in the last 24 hours shows sinus bradycardia mostly in the 40s, better than before when he had heart rates even in the mid 30s.  Also has sinus rhythm and junctional rhythm.  Orthostatic blood pressures 9/25 dropped from 174/74 supine to 68/51 standing without heart rate response.  Better yesterday 141/61 > 109/70 without appropriate heart rate response (40 > 37).  On day of discharge again significantly orthostatic but asymptomatic of same.  Does have appropriate heart rate response today however.  Lying (BP 134/94, HR 55), sitting (BP 89/70, HR 66), standing (BP 82/61, HR 76).  EP cardiology input appreciated, feel that this is autonomic failure.  Cardiology discussed multiple interventions to improve his orthostatic hypotension and decrease fall risk including medications i.e. midodrine, pyridostigmine; abdominal binders and thigh-high compression stockings but patient declined all of these to them.    Patient since then agreeable to bilateral lower extremity TED hoses which he is currently wearing and initiation of any medications.    Hydrated with IV fluids for approximately 48 hours.  Clinically euvolemic.  Amlodipine discontinued at discharge.  No driving for 6 months, patient counseled.  I discussed in detail with Dr. Graciela Husbands, EP Cardiology who had seen him in initial consult.  Reviewed case in detail.  Reviewed patient's SBP's are mostly in the 140s-160s and hence recommended ProAmatine 5 mg 3  times daily at 7 AM, 11 AM and 3 PM daily, continue lower extremity compressive stockings and outpatient follow-up with him as needed if he has persistent orthostatic  hypotension.  However patient already has a follow-up appointment with Dr. Johney Frame and can keep that.  Close outpatient follow-up with PCP, midodrine can cause hypertension and can be reassessed during that visit in which case dose may have to be reduced.  Patient has been counseled extensively regarding measures to minimize orthostatic symptoms and side effects in presence of RN auntie verbalized understanding.  Bradycardia due to sinus node dysfunction  EP cardiology input appreciated and do not think there is an indication for pacemaker.  Moreover patient is not interested in a pacemaker.  They indicated that amyloid could cause sinus node dysfunction and autonomic insufficiency which could be evaluated by cardiac MRI that can be done post COVID as outpatient.  Heart rates better in the last 24 hours than the 24 hours prior.  Sinus bradycardia mostly in the 40s overnight, has some junctional rhythm and sinus rhythm as well.  No significant pauses.  Acute kidney injury complicating Stage III chronic kidney disease.  Creatinine in April: 1.43, August: 2.43, now presented with creatinine of 2.45.  Etiology of acute kidney injury not clear.  Also not sure if he now has a higher baseline creatinine.  After IV fluids, creatinine has improved, 2.19 > 1.91 >1.68.  Counseled patient regarding adequate oral hydration, avoid NSAID use and outpatient follow-up with PCP with repeat BMP.  COVID positive  Stable and asymptomatic.  Not hypoxemic.  Continue contact and airborne precautions.  Patient reports that his sister with whom he lives was tested after he turned positive and her test was negative.  Patient has been counseled regarding quarantine precautions when he returns home and he verbalizes understanding.  Essential hypertension  Currently controlled.  And as indicated by cardiology, may need to allow blood pressures to run high.  No amlodipine at discharge.  Monitor BP closely while  midodrine initiated.  Anxiety disorder/history of seizures  Patient unable to recollect his ADD medications.  Resumed home as needed Valium and amitriptyline.  Continue gabapentin.  Thrombocytopenia  Unclear etiology.  Mild and stable.  Has mild pancytopenia.  Follow CBC, relatively stable.  Some of his hemoglobin drop may be dilutional from IV fluids.  Follow CBC as outpatient closely.  Substance abuse/cocaine  UDS positive for benzodiazepines (patient on Valium) and cocaine.  BAL <10.  S/P Right middle finger surgery 9/5   By Dr. Amanda Pea for crush wound  Appears to be healing well and scabbed.  No features suggestive of acute infection.  Patient asking for more pain medications, will not escalate at this time.  He does not truly appear to be in discomfort.  Outpatient follow-up with hand surgery.    Consultants:  EP Cardiology  Procedures:  None   Discharge Instructions  Discharge Instructions    Call MD for:   Complete by: As directed    Passing out or feeling like passing out.   Call MD for:  difficulty breathing, headache or visual disturbances   Complete by: As directed    Call MD for:  extreme fatigue   Complete by: As directed    Call MD for:  persistant dizziness or light-headedness   Complete by: As directed    Call MD for:  persistant nausea and vomiting   Complete by: As directed    Call MD for:  redness, tenderness, or signs of  infection (pain, swelling, redness, odor or green/yellow discharge around incision site)   Complete by: As directed    Call MD for:  severe uncontrolled pain   Complete by: As directed    Call MD for:  temperature >100.4   Complete by: As directed    Diet - low sodium heart healthy   Complete by: As directed    Driving Restrictions   Complete by: As directed    No driving for 6 months.   Increase activity slowly   Complete by: As directed        Medication List    STOP taking these medications    amLODipine 10 MG tablet Commonly known as: NORVASC     TAKE these medications   amitriptyline 100 MG tablet Commonly known as: ELAVIL Take 100 mg by mouth at bedtime.   diazepam 10 MG tablet Commonly known as: VALIUM Take 10 mg by mouth every 6 (six) hours as needed for anxiety.   esomeprazole 20 MG capsule Commonly known as: NEXIUM Take 20 mg by mouth daily at 12 noon.   gabapentin 600 MG tablet Commonly known as: NEURONTIN Take 2 tablets (1,200 mg total) by mouth at bedtime.   HYDROcodone-acetaminophen 5-325 MG tablet Commonly known as: NORCO/VICODIN Take 1-2 tablets by mouth every 4 (four) hours as needed for moderate pain (pain score 4-6).   midodrine 5 MG tablet Commonly known as: PROAMATINE Take 1 tablet (5 mg total) by mouth 3 (three) times daily. To be taken daily at 7 AM, 11 AM and 3 PM.      No Known Allergies    Procedures/Studies: Ct Head Wo Contrast  Result Date: 05/27/2019 CLINICAL DATA:  Altered level of consciousness EXAM: CT HEAD WITHOUT CONTRAST CT CERVICAL SPINE WITHOUT CONTRAST TECHNIQUE: Multidetector CT imaging of the head and cervical spine was performed following the standard protocol without intravenous contrast. Multiplanar CT image reconstructions of the cervical spine were also generated. COMPARISON:  04/29/2019 FINDINGS: CT HEAD FINDINGS Brain: No evidence of acute infarction, hemorrhage, hydrocephalus, extra-axial collection or mass lesion/mass effect. Vascular: No hyperdense vessel or unexpected calcification. Skull: Normal. Negative for fracture or focal lesion. Sinuses/Orbits: No acute finding. Other: None. CT CERVICAL SPINE FINDINGS Alignment: Normal. Skull base and vertebrae: No acute fracture. No primary bone lesion or focal pathologic process. Soft tissues and spinal canal: No prevertebral fluid or swelling. No visible canal hematoma. Disc levels: Moderate disc degenerative and osteophytosis of the lower cervical spine. Upper chest:  Negative. Other: None. IMPRESSION: 1.  No acute intracranial pathology. 2.  No fracture or static subluxation of the cervical spine. Electronically Signed   By: Lauralyn PrimesAlex  Bibbey M.D.   On: 05/27/2019 13:23   Ct Cervical Spine Wo Contrast  Result Date: 05/27/2019 CLINICAL DATA:  Altered level of consciousness EXAM: CT HEAD WITHOUT CONTRAST CT CERVICAL SPINE WITHOUT CONTRAST TECHNIQUE: Multidetector CT imaging of the head and cervical spine was performed following the standard protocol without intravenous contrast. Multiplanar CT image reconstructions of the cervical spine were also generated. COMPARISON:  04/29/2019 FINDINGS: CT HEAD FINDINGS Brain: No evidence of acute infarction, hemorrhage, hydrocephalus, extra-axial collection or mass lesion/mass effect. Vascular: No hyperdense vessel or unexpected calcification. Skull: Normal. Negative for fracture or focal lesion. Sinuses/Orbits: No acute finding. Other: None. CT CERVICAL SPINE FINDINGS Alignment: Normal. Skull base and vertebrae: No acute fracture. No primary bone lesion or focal pathologic process. Soft tissues and spinal canal: No prevertebral fluid or swelling. No visible canal hematoma. Disc levels: Moderate disc  degenerative and osteophytosis of the lower cervical spine. Upper chest: Negative. Other: None. IMPRESSION: 1.  No acute intracranial pathology. 2.  No fracture or static subluxation of the cervical spine. Electronically Signed   By: Lauralyn Primes M.D.   On: 05/27/2019 13:23      Subjective: Felt a little tired earlier this morning but better at the time of my visit.  Denies dizziness, lightheadedness or feeling like passing out on standing up or ambulating despite positive orthostatic blood pressures.  Denies pain or any other complaints.  Anxious to go home.  As per RN, no acute issues reported.  Discharge Exam:  Vitals:   05/29/19 2324 05/30/19 1700 05/30/19 2034 05/31/19 0810  BP: (!) 165/84 (!) 156/70 (!) 152/73 (!) 147/68  Pulse:  (!) 50 (!) 48 (!) 42 63  Resp: 18  20 16   Temp: 98 F (36.7 C) 98.9 F (37.2 C) 97.8 F (36.6 C) 98.2 F (36.8 C)  TempSrc: Oral Oral Oral Oral  SpO2: 98% 96% 100% 98%  Weight:      Height:        General exam: Pleasant young male, moderately built and nourished lying comfortably propped up in bed without distress. Respiratory system: Clear to auscultation.  No increased work of breathing. Cardiovascular system: S1 and S2 heard, regular bradycardic.  No JVD or murmurs.    Telemetry personally reviewed and findings as noted above. Gastrointestinal system: Abdomen is nondistended, soft and nontender. No organomegaly or masses felt. Normal bowel sounds heard. Central nervous system: Alert and oriented. No focal neurological deficits. Extremities: Symmetric 5 x 5 power.  Right middle finger distal phalanx surgical site wound is scabbed, minimal peri-scab faint redness but no drainage or tenderness and no features suggestive of cellulitis or wound infection. Skin: No rashes, lesions or ulcers Psychiatry: Judgement and insight appear normal. Mood & affect appropriate.     The results of significant diagnostics from this hospitalization (including imaging, microbiology, ancillary and laboratory) are listed below for reference.     Microbiology: Recent Results (from the past 240 hour(s))  SARS CORONAVIRUS 2 (TAT 6-24 HRS) Nasopharyngeal Nasopharyngeal Swab     Status: Abnormal   Collection Time: 05/27/19  4:25 PM   Specimen: Nasopharyngeal Swab  Result Value Ref Range Status   SARS Coronavirus 2 POSITIVE (A) NEGATIVE Final    Comment: (NOTE) SARS-CoV-2 target nucleic acids are DETECTED. The SARS-CoV-2 RNA is generally detectable in upper and lower respiratory specimens during the acute phase of infection. Positive results are indicative of active infection with SARS-CoV-2. Clinical  correlation with patient history and other diagnostic information is necessary to determine patient  infection status. Positive results do  not rule out bacterial infection or co-infection with other viruses. The expected result is Negative. Fact Sheet for Patients: 05/29/19 Fact Sheet for Healthcare Providers: HairSlick.no This test is not yet approved or cleared by the quierodirigir.com FDA and  has been authorized for detection and/or diagnosis of SARS-CoV-2 by FDA under an Emergency Use Authorization (EUA). This EUA will remain  in effect (meaning this test can be used) for the duration of the COVID-19 declaration under Section 564(b)(1) of the Act, 21 U.S.C.  section 360bbb-3(b)(1), unless the authorization is terminated or revoked sooner. Performed at [facility], [address] [place], [place]      Labs: CBC: Recent Labs  Lab 05/27/19 1318 05/29/19 0723 05/30/19 0514 05/31/19 0437  WBC 7.1 4.4 3.2* 3.4*  NEUTROABS 5.7  --   --   --  HGB 13.4 12.3* 11.8* 10.6*  HCT 41.8 38.2* 35.9* 33.3*  MCV 89.9 89.5 88.0 92.0  PLT 149* 148* 140* 263*   Basic Metabolic Panel: Recent Labs  Lab 05/27/19 1318 05/29/19 0723 05/30/19 0514 05/31/19 0437  NA 137 139 139 142  K 5.0 4.6 4.3 4.2  CL 104 107 108 111  CO2 24 25 24 24   GLUCOSE 109* 89 103* 154*  BUN 44* 33* 32* 26*  CREATININE 2.45* 2.19* 1.91* 1.68*  CALCIUM 9.0 8.9 8.8* 8.7*  MG 2.0  --   --   --    Liver Function Tests: Recent Labs  Lab 05/27/19 1318  AST 12*  ALT 10  ALKPHOS 92  BILITOT 0.2*  PROT 7.0  ALBUMIN 3.9   CBG: Recent Labs  Lab 05/27/19 1322  GLUCAP 93   Urinalysis    Component Value Date/Time   COLORURINE YELLOW 05/27/2019 1640   APPEARANCEUR CLEAR 05/27/2019 1640   LABSPEC 1.013 05/27/2019 1640   PHURINE 6.0 05/27/2019 1640   GLUCOSEU NEGATIVE 05/27/2019 1640   HGBUR NEGATIVE 05/27/2019 1640   BILIRUBINUR NEGATIVE 05/27/2019 1640   KETONESUR NEGATIVE 05/27/2019 1640   PROTEINUR NEGATIVE 05/27/2019  1640   NITRITE NEGATIVE 05/27/2019 1640   LEUKOCYTESUR NEGATIVE 05/27/2019 1640      Time coordinating discharge: 40 minutes  SIGNED:  Vernell Leep, MD, FACP, [facility]. [facility]  To contact the attending provider between 7A-7P or the covering provider during after hours 7P-7A, please log into the web site [email or web address] and access using universal [place] password for that web site. If you do not have the password, please call the hospital operator.

## 2019-05-31 NOTE — Discharge Instructions (Signed)
Please get your medications reviewed and adjusted by your Primary MD. ° °Please request your Primary MD to go over all Hospital Tests and Procedure/Radiological results at the follow up, please get all Hospital records sent to your Prim MD by signing hospital release before you go home. ° °If you had Pneumonia of Lung problems at the Hospital: °Please get a 2 view Chest X ray done in 6-8 weeks after hospital discharge or sooner if instructed by your Primary MD. ° °If you have Congestive Heart Failure: °Please call your Cardiologist or Primary MD anytime you have any of the following symptoms:  °1) 3 pound weight gain in 24 hours or 5 pounds in 1 week  °2) shortness of breath, with or without a dry hacking cough  °3) swelling in the hands, feet or stomach  °4) if you have to sleep on extra pillows at night in order to breathe ° °Follow cardiac low salt diet and 1.5 lit/day fluid restriction. ° °If you have diabetes °Accuchecks 4 times/day, Once in AM empty stomach and then before each meal. °Log in all results and show them to your primary doctor at your next visit. °If any glucose reading is under 80 or above 300 call your primary MD immediately. ° °If you have Seizure/Convulsions/Epilepsy: °Please do not drive, operate heavy machinery, participate in activities at heights or participate in high speed sports until you have seen by Primary MD or a Neurologist and advised to do so again. ° °If you had Gastrointestinal Bleeding: °Please ask your Primary MD to check a complete blood count within one week of discharge or at your next visit. Your endoscopic/colonoscopic biopsies that are pending at the time of discharge, will also need to followed by your Primary MD. ° °Get Medicines reviewed and adjusted. °Please take all your medications with you for your next visit with your Primary MD ° °Please request your Primary MD to go over all hospital tests and procedure/radiological results at the follow up, please ask your  Primary MD to get all Hospital records sent to his/her office. ° °If you experience worsening of your admission symptoms, develop shortness of breath, life threatening emergency, suicidal or homicidal thoughts you must seek medical attention immediately by calling 911 or calling your MD immediately  if symptoms less severe. ° °You must read complete instructions/literature along with all the possible adverse reactions/side effects for all the Medicines you take and that have been prescribed to you. Take any new Medicines after you have completely understood and accpet all the possible adverse reactions/side effects.  ° °Do not drive or operate heavy machinery when taking Pain medications.  ° °Do not take more than prescribed Pain, Sleep and Anxiety Medications ° °Special Instructions: If you have smoked or chewed Tobacco  in the last 2 yrs please stop smoking, stop any regular Alcohol  and or any Recreational drug use. ° °Wear Seat belts while driving. ° °Please note °You were cared for by a hospitalist during your hospital stay. If you have any questions about your discharge medications or the care you received while you were in the hospital after you are discharged, you can call the unit and asked to speak with the hospitalist on call if the hospitalist that took care of you is not available. Once you are discharged, your primary care physician will handle any further medical issues. Please note that NO REFILLS for any discharge medications will be authorized once you are discharged, as it is imperative that you   return to your primary care physician (or establish a relationship with a primary care physician if you do not have one) for your aftercare needs so that they can reassess your need for medications and monitor your lab values.  You can reach the hospitalist office at phone [phone] or fax [phone]   If you do not have a primary care physician, you can call [phone] for a physician  referral.    Near-Syncope Near-syncope is when you suddenly get weak or dizzy, or you feel like you might pass out (faint). This may also be called presyncope. This is due to a lack of blood flow to the brain. During an episode of near-syncope, you may:  Feel dizzy, weak, or light-headed.  Feel sick to your stomach (nauseous).  See all white or all black.  See spots.  Have cold, clammy skin. This condition is caused by a sudden decrease in blood flow to the brain. This decrease can result from various causes, but most of those causes are not dangerous. However, near-syncope may be a sign of a serious medical problem, so it is important to seek medical care. Follow these instructions at home: Medicines  Take over-the-counter and prescription medicines only as told by your doctor.  If you are taking blood pressure or heart medicine, get up slowly and spend many minutes getting ready to sit and then stand. This can help with dizziness. General instructions  Be aware of any changes in your symptoms.  Talk with your doctor about your symptoms. You may need to have testing to find the cause of your near-syncope.  If you start to feel like you might pass out, lie down right away. Raise (elevate) your feet above the level of your heart. Breathe deeply and steadily. Wait until all of the symptoms are gone.  Have someone stay with you until you feel stable.  Do not drive, use machinery, or play sports until your doctor says it is okay.  Drink enough fluid to keep your pee (urine) pale yellow.  Keep all follow-up visits as told by your doctor. This is important. Get help right away if you:  Have a seizure.  Have pain in your: ? Chest. ? Belly (abdomen). ? Back.  Faint once or more than once.  Have a very bad headache.  Are bleeding from your mouth or butt.  Have black or tarry poop (stool).  Have a very fast or uneven heartbeat (palpitations).  Are mixed up  (confused).  Have trouble walking.  Are very weak.  Have trouble seeing. These symptoms may be an emergency. Do not wait to see if the symptoms will go away. Get medical help right away. Call your local emergency services (911 in the [place] Do not drive yourself to the hospital. Summary  Near-syncope is when you suddenly get weak or dizzy, or you feel like you might pass out (faint).  This condition is caused by a lack of blood flow to the brain.  Near-syncope may be a sign of a serious medical problem, so it is important to seek medical care. This information is not intended to replace advice given to you by your health care provider. Make sure you discuss any questions you have with your health care provider. Document Released: 02/06/2008 Document Revised: 12/12/2018 Document Reviewed: 07/09/2018 Elsevier Patient Education  2020 [employer].

## 2019-06-18 ENCOUNTER — Other Ambulatory Visit: Payer: Self-pay

## 2019-06-18 DIAGNOSIS — Z20822 Contact with and (suspected) exposure to covid-19: Secondary | ICD-10-CM

## 2019-06-19 LAB — NOVEL CORONAVIRUS, NAA: SARS-CoV-2, NAA: NOT DETECTED

## 2019-06-22 ENCOUNTER — Ambulatory Visit: Payer: Medicaid - Out of State | Admitting: Cardiology

## 2019-07-01 ENCOUNTER — Other Ambulatory Visit: Payer: Self-pay | Admitting: Internal Medicine

## 2019-07-01 MED ORDER — MIDODRINE HCL 5 MG PO TABS: 5.0000 mg | ORAL_TABLET | Freq: Three times a day (TID) | ORAL | 0 refills | Status: DC

## 2019-07-01 NOTE — Telephone Encounter (Signed)
°*  STAT* If patient is at the pharmacy, call can be transferred to refill team.   1. Which medications need to be refilled? midodrine (PROAMATINE) 5 MG tablet   2. Which pharmacy/location (including street and city if local pharmacy) is medication to be sent to? [facility] [place]  3. Do they need a 30 day or 90 day supply? 90]   Patient is out of medication

## 2019-07-01 NOTE — Telephone Encounter (Signed)
Medication sent to pharmacy  

## 2019-07-10 ENCOUNTER — Ambulatory Visit: Payer: Medicaid - Out of State | Admitting: Internal Medicine

## 2019-07-21 ENCOUNTER — Telehealth: Payer: Self-pay | Admitting: Cardiovascular Disease

## 2019-07-21 NOTE — Telephone Encounter (Signed)
Virtual Visit Pre-Appointment Phone Call  "(Name), I am calling you today to discuss your upcoming appointment. We are currently trying to limit exposure to the virus that causes COVID-19 by seeing patients at home rather than in the office."  1. "What is the BEST phone number to call the day of the visit?" - include this in appointment notes  2. Do you have or have access to (through a family member/friend) a smartphone with video capability that we can use for your visit?" a. If yes - list this number in appt notes as cell (if different from BEST phone #) and list the appointment type as a VIDEO visit in appointment notes b. If no - list the appointment type as a PHONE visit in appointment notes  Confirm consent - "In the setting of the current Covid19 crisis, you are scheduled for a (phone or video) visit with your provider on (date) at (time).  Just as we do with many in-office visits, in order for you to participate in this visit, we must obtain consent.  If you'd like, I can send this to your mychart (if signed up) or email for you to review.  Otherwise, I can obtain your verbal consent now.  All virtual visits are billed to your insurance company just like a normal visit would be.  By agreeing to a virtual visit, we'd like you to understand that the technology does not allow for your provider to perform an examination, and thus may limit your provider's ability to fully assess your condition. If your provider identifies any concerns that need to be evaluated in person, we will make arrangements to do so.  Finally, though the technology is pretty good, we cannot assure that it will always work on either your or our end, and in the setting of a video visit, we may have to convert it to a phone-only visit.  In either situation, we cannot ensure that we have a secure connection.  Are you willing to proceed?" STAFF: Did the patient verbally acknowledge consent to telehealth visit? Document  YES/NO here: YES 3. Advise patient to be prepared - "Two hours prior to your appointment, go ahead and check your blood pressure, pulse, oxygen saturation, and your weight (if you have the equipment to check those) and write them all down. When your visit starts, your provider will ask you for this information. If you have an Apple Watch or Kardia device, please plan to have heart rate information ready on the day of your appointment. Please have a pen and paper handy nearby the day of the visit as well."  4. Give patient instructions for MyChart download to smartphone OR Doximity/Doxy.me as below if video visit (depending on what platform provider is using)  5. Inform patient they will receive a phone call 15 minutes prior to their appointment time (may be from unknown caller ID) so they should be prepared to answer    TELEPHONE CALL NOTE  Javier Rose has been deemed a candidate for a follow-up tele-health visit to limit community exposure during the Covid-19 pandemic. I spoke with the patient via phone to ensure availability of phone/video source, confirm preferred email & phone number, and discuss instructions and expectations.  I reminded Javier Rose to be prepared with any vital sign and/or heart rhythm information that could potentially be obtained via home monitoring, at the time of his visit. I reminded Javier Rose to expect a phone call prior to his visit.  Javier Rose  Javier Rose 07/21/2019 11:52 AM   INSTRUCTIONS FOR DOWNLOADING THE MYCHART APP TO SMARTPHONE  - The patient must first make sure to have activated MyChart and know their login information - If Apple, go to [employer] and type in MyChart in the search bar and download the app. If Android, ask patient to go to [employer] and type in [place] in the search bar and download the app. The app is free but as with any other app downloads, their phone may require them to verify saved payment information or Apple/Android  password.  - The patient will need to then log into the app with their MyChart username and password, and select [facility] as their healthcare provider to link the account. When it is time for your visit, go to the MyChart app, find appointments, and click Begin Video Visit. Be sure to Select Allow for your device to access the Microphone and Camera for your visit. You will then be connected, and your provider will be with you shortly.  **If they have any issues connecting, or need assistance please contact MyChart service desk [phone])**  **If using a computer, in order to ensure the best quality for their visit they will need to use either of the following Internet Browsers: [employer], or Google Chrome**  IF USING DOXIMITY or DOXY.ME - The patient will receive a link just prior to their visit by text.     FULL LENGTH CONSENT FOR TELE-HEALTH VISIT   I hereby voluntarily request, consent and authorize [facility] and its employed or contracted physicians, physician assistants, nurse practitioners or other licensed health care professionals (the Practitioner), to provide me with telemedicine health care services (the Services") as deemed necessary by the treating Practitioner. I acknowledge and consent to receive the Services by the Practitioner via telemedicine. I understand that the telemedicine visit will involve communicating with the Practitioner through live audiovisual communication technology and the disclosure of certain medical information by electronic transmission. I acknowledge that I have been given the opportunity to request an in-person assessment or other available alternative prior to the telemedicine visit and am voluntarily participating in the telemedicine visit.  I understand that I have the right to withhold or withdraw my consent to the use of telemedicine in the course of my care at any time, without affecting my right to future care or treatment,  and that the Practitioner or I may terminate the telemedicine visit at any time. I understand that I have the right to inspect all information obtained and/or recorded in the course of the telemedicine visit and may receive copies of available information for a reasonable fee.  I understand that some of the potential risks of receiving the Services via telemedicine include:   Delay or interruption in medical evaluation due to technological equipment failure or disruption;  Information transmitted may not be sufficient (e.g. poor resolution of images) to allow for appropriate medical decision making by the Practitioner; and/or   In rare instances, security protocols could fail, causing a breach of personal health information.  Furthermore, I acknowledge that it is my responsibility to provide information about my medical history, conditions and care that is complete and accurate to the best of my ability. I acknowledge that Practitioner's advice, recommendations, and/or decision may be based on factors not within their control, such as incomplete or inaccurate data provided by me or distortions of diagnostic images or specimens that may result from electronic transmissions. I understand that the practice of  medicine is not an Chief Strategy Officer and that Practitioner makes no warranties or guarantees regarding treatment outcomes. I acknowledge that I will receive a copy of this consent concurrently upon execution via email to the email address I last provided but may also request a printed copy by calling the office of [facility].    I understand that my insurance will be billed for this visit.   I have read or had this consent read to me.  I understand the contents of this consent, which adequately explains the benefits and risks of the Services being provided via telemedicine.   I have been provided ample opportunity to ask questions regarding this consent and the Services and have had my questions  answered to my satisfaction.  I give my informed consent for the services to be provided through the use of telemedicine in my medical care  By participating in this telemedicine visit I agree to the above.

## 2019-07-29 ENCOUNTER — Other Ambulatory Visit: Payer: Self-pay | Admitting: Internal Medicine

## 2019-08-03 ENCOUNTER — Encounter: Payer: PRIVATE HEALTH INSURANCE | Admitting: Cardiovascular Disease

## 2019-08-03 NOTE — Progress Notes (Signed)
  [place] [facility] This encounter was created in error - please disregard.

## 2019-08-04 NOTE — Progress Notes (Signed)
This encounter was created in error - please disregard.

## 2019-08-06 ENCOUNTER — Telehealth: Payer: PRIVATE HEALTH INSURANCE | Admitting: Cardiovascular Disease

## 2019-08-07 ENCOUNTER — Ambulatory Visit: Payer: Medicaid - Out of State | Admitting: Internal Medicine

## 2019-08-24 ENCOUNTER — Other Ambulatory Visit: Payer: Self-pay | Admitting: Internal Medicine

## 2019-10-28 ENCOUNTER — Other Ambulatory Visit: Payer: Self-pay | Admitting: *Deleted

## 2019-10-29 NOTE — Telephone Encounter (Signed)
I have never seen this patient.  He has recently seen Dr Graciela Husbands. I would be reluctant to fill any medicines until he has established with me.  I would direct this to Dr Graciela Husbands in the interim.

## 2019-11-26 ENCOUNTER — Ambulatory Visit: Payer: Medicaid Other | Attending: Internal Medicine

## 2019-11-26 DIAGNOSIS — Z23 Encounter for immunization: Secondary | ICD-10-CM

## 2019-11-26 NOTE — Progress Notes (Signed)
   Covid-19 Vaccination Clinic  Name:  Javier Rose    MRN: [id number] DOB: 1961-03-08  11/26/2019  [name] was observed post Covid-19 immunization for 15 minutes without incident. He was provided with Vaccine Information Sheet and instruction to access the V-Safe system.   [name] was instructed to call 911 with any severe reactions post vaccine: Marland Kitchen Difficulty breathing  . Swelling of face and throat  . A fast heartbeat  . A bad rash all over body  . Dizziness and weakness   Immunizations Administered    Name Date Dose VIS Date Route   Moderna COVID-19 Vaccine 11/26/2019 11:54 AM 0.5 mL 08/04/2019 Intramuscular   Manufacturer: Moderna   Lot: [id number]   NDC: 43200-379-44

## 2019-12-29 ENCOUNTER — Ambulatory Visit: Payer: Medicaid Other | Attending: Internal Medicine

## 2019-12-29 DIAGNOSIS — Z23 Encounter for immunization: Secondary | ICD-10-CM

## 2019-12-29 NOTE — Progress Notes (Signed)
   Covid-19 Vaccination Clinic  Name:  Javier Rose    MRN: [id number] DOB: [birth year]  12/29/2019  [name] was observed post Covid-19 immunization for 15 minutes without incident. He was provided with Vaccine Information Sheet and instruction to access the V-Safe system.   [name] was instructed to call 911 with any severe reactions post vaccine: Marland Kitchen Difficulty breathing  . Swelling of face and throat  . A fast heartbeat  . A bad rash all over body  . Dizziness and weakness   Immunizations Administered    Name Date Dose VIS Date Route   Moderna COVID-19 Vaccine 12/29/2019 11:37 AM 0.5 mL 08/2019 Intramuscular   Manufacturer: Moderna   Lot: [id number]   NDC: 76734-193-79

## 2020-02-22 ENCOUNTER — Other Ambulatory Visit: Payer: Self-pay | Admitting: Internal Medicine

## 2020-04-12 ENCOUNTER — Other Ambulatory Visit: Payer: Self-pay | Admitting: Internal Medicine

## 2020-06-07 ENCOUNTER — Other Ambulatory Visit: Payer: Self-pay | Admitting: Internal Medicine

## 2021-03-16 IMAGING — CT CT CERVICAL SPINE WITHOUT CONTRAST
4 of 9 series · 10 of 33 positions shown, 11 images · non-contrast
Comparison: None.

CLINICAL DATA: Weakness, hypertension

EXAM:
CT HEAD WITHOUT CONTRAST
CT CERVICAL SPINE WITHOUT CONTRAST
TECHNIQUE: Multidetector CT imaging of the head and cervical spine was
performed following the standard protocol without intravenous
contrast. Multiplanar CT image reconstructions of the cervical spine
were also generated.

[Series 9: c spine soft · axial · 0.35mm/px · z∈[-134,-68]mm · 2 of 98 slices shown]
[im 33/98  soft-tissue]
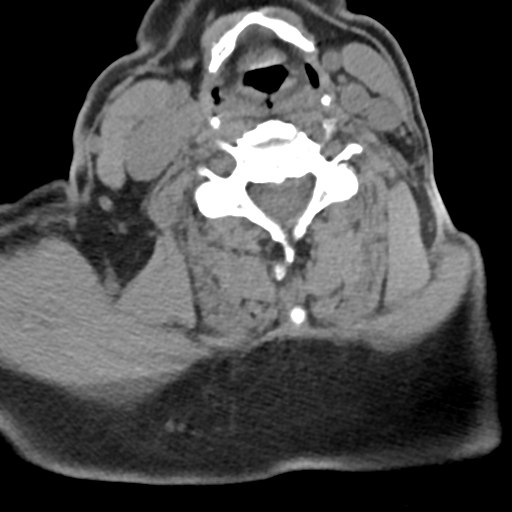
[im 65/98  soft-tissue]
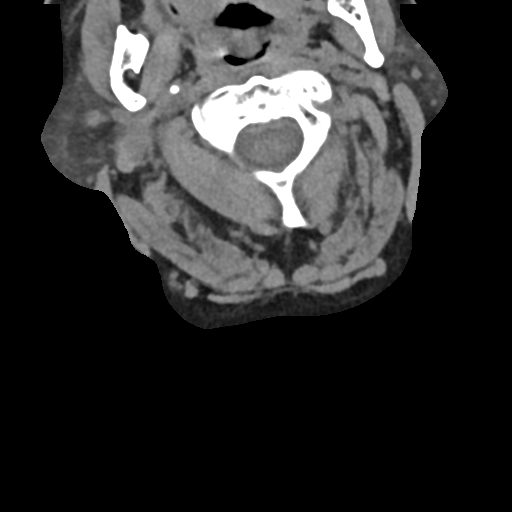

[Series 10: sagittal bone · sagittal · 0.30mm/px · 5 of 61 slices shown]
[im 11/61  bone]
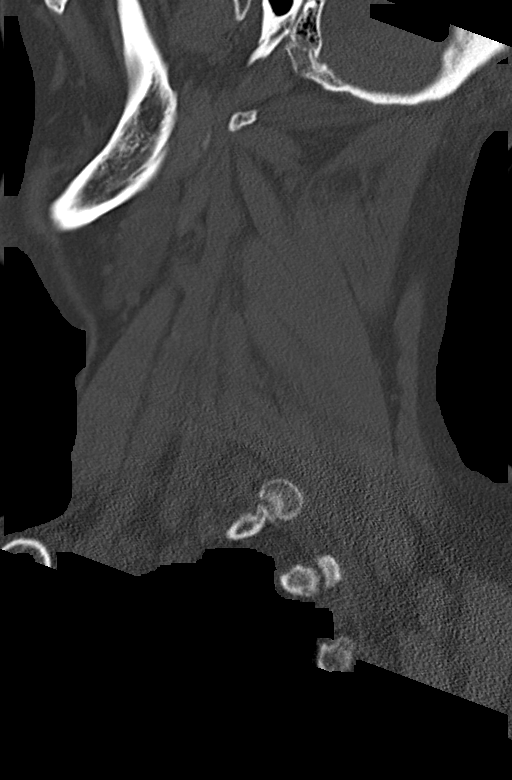
[im 21/61  bone]
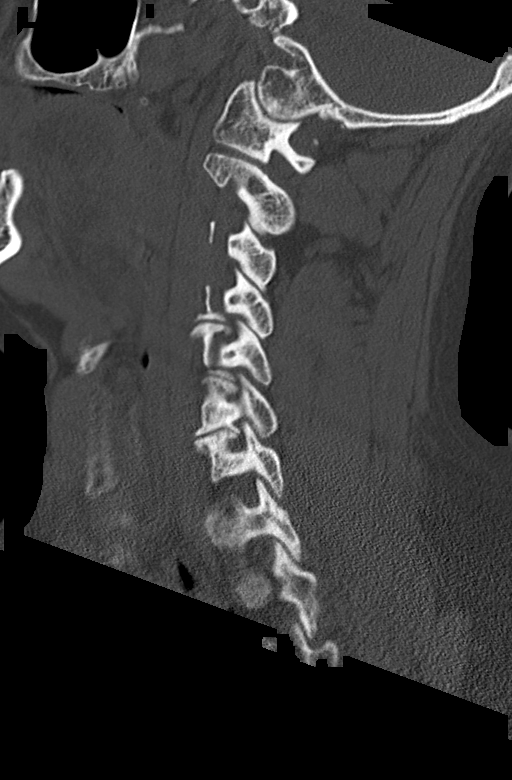
[im 31/61  bone]
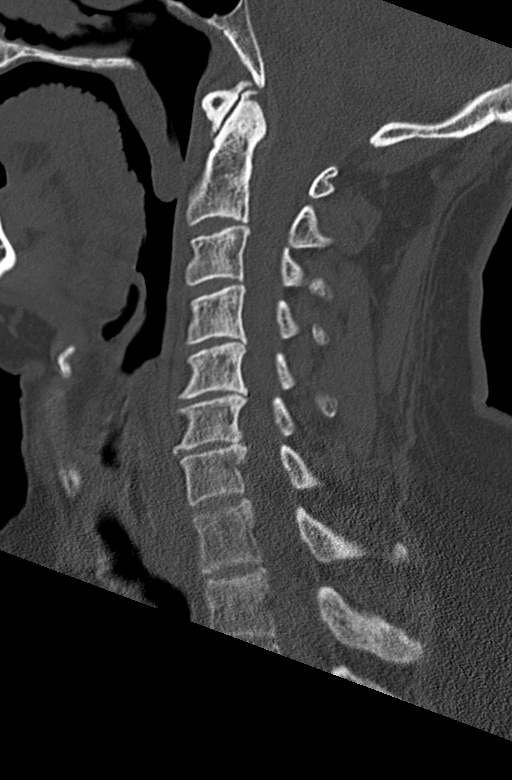
[im 41/61  bone]
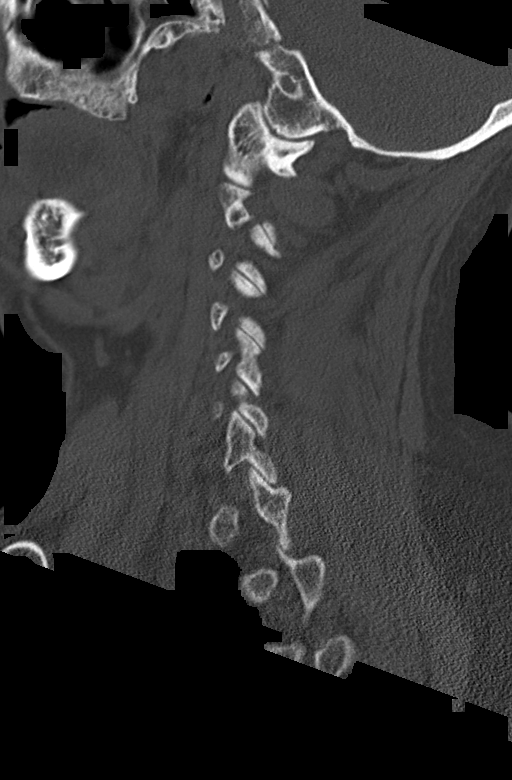
[im 51/61  bone]
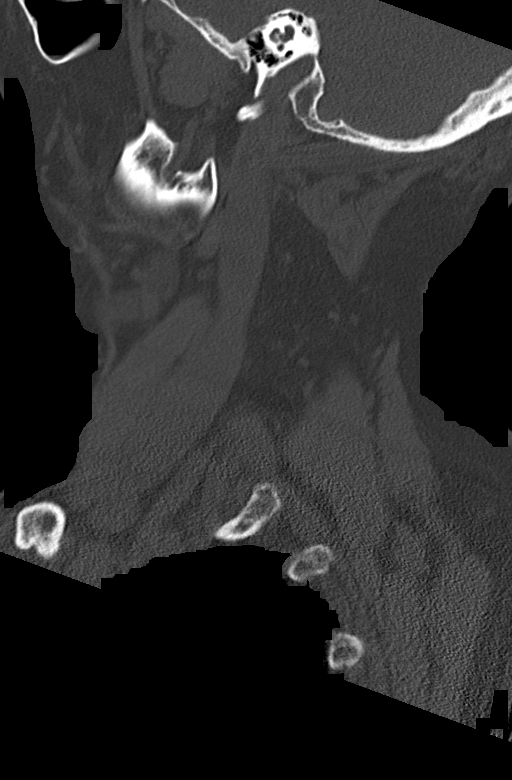

[Series 11: coronal bone · coronal · 0.30mm/px · 1 of 61 slices shown]
[im 31/61  bone]
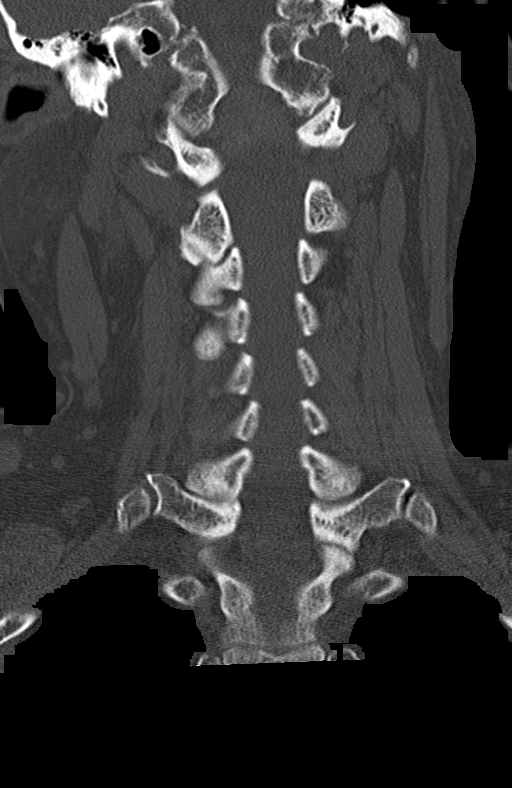

[Series 12: orthogonal axials · axial · 0.21mm/px · z∈[-164,-110]mm · 2 of 93 slices shown, 3 images]
[im 31/93  soft-tissue]
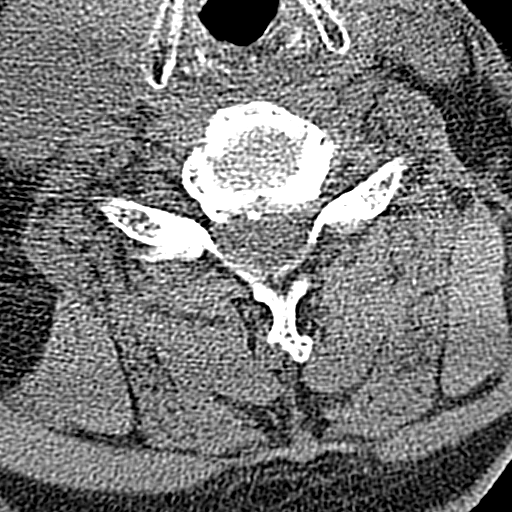
[im 31/93  bone]
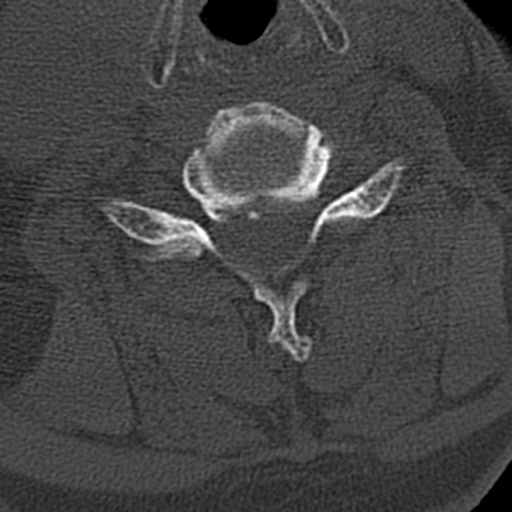
[im 62/93  bone]
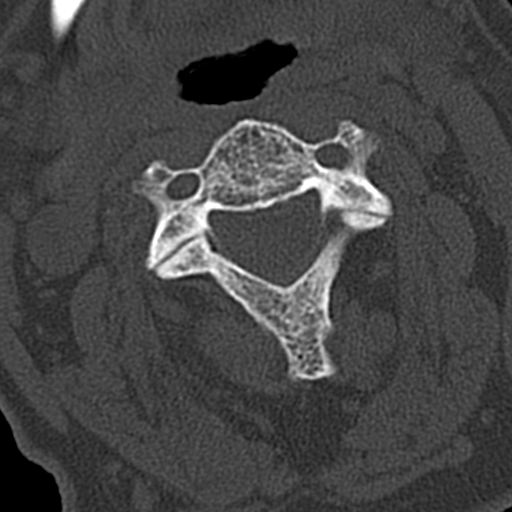

[10 of 33 positions shown; findings below may reference images not displayed]

FINDINGS: CT HEAD FINDINGS

Brain: No evidence of acute infarction, hemorrhage, hydrocephalus,
extra-axial collection or mass lesion/mass effect.

Vascular: No hyperdense vessel or unexpected calcification.

Skull: Normal. Negative for fracture or focal lesion.

Sinuses/Orbits: No acute finding.

Other: None.

CT CERVICAL SPINE FINDINGS

Alignment: Normal.

Skull base and vertebrae: No acute fracture. No primary bone lesion
or focal pathologic process.

Soft tissues and spinal canal: No prevertebral fluid or swelling. No
visible canal hematoma.

Disc levels: Mild multilevel degenerative changes throughout the
cervical spine, most pronounced at C4-5 through C6-7.

Upper chest: Paraseptal emphysema noted within the bilateral lung
apices.

Other: None.
IMPRESSION: 1. No acute intracranial findings.
2. No acute fracture or malalignment of the cervical spine.
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
## 2021-05-10 DIAGNOSIS — R7309 Other abnormal glucose: Secondary | ICD-10-CM | POA: Diagnosis not present

## 2021-05-10 DIAGNOSIS — Z136 Encounter for screening for cardiovascular disorders: Secondary | ICD-10-CM | POA: Diagnosis not present

## 2021-05-10 DIAGNOSIS — R768 Other specified abnormal immunological findings in serum: Secondary | ICD-10-CM | POA: Diagnosis not present

## 2021-05-10 DIAGNOSIS — Z23 Encounter for immunization: Secondary | ICD-10-CM | POA: Diagnosis not present

## 2021-05-10 DIAGNOSIS — Z0283 Encounter for blood-alcohol and blood-drug test: Secondary | ICD-10-CM | POA: Diagnosis not present

## 2021-05-10 DIAGNOSIS — R5383 Other fatigue: Secondary | ICD-10-CM | POA: Diagnosis not present

## 2021-05-10 DIAGNOSIS — R569 Unspecified convulsions: Secondary | ICD-10-CM | POA: Diagnosis not present

## 2021-05-10 DIAGNOSIS — Z125 Encounter for screening for malignant neoplasm of prostate: Secondary | ICD-10-CM | POA: Diagnosis not present

## 2021-05-10 DIAGNOSIS — K219 Gastro-esophageal reflux disease without esophagitis: Secondary | ICD-10-CM | POA: Diagnosis not present

## 2021-05-10 DIAGNOSIS — F39 Unspecified mood [affective] disorder: Secondary | ICD-10-CM | POA: Diagnosis not present

## 2021-05-10 DIAGNOSIS — Z13220 Encounter for screening for lipoid disorders: Secondary | ICD-10-CM | POA: Diagnosis not present

## 2021-05-10 DIAGNOSIS — Z760 Encounter for issue of repeat prescription: Secondary | ICD-10-CM | POA: Diagnosis not present

## 2021-05-10 DIAGNOSIS — I10 Essential (primary) hypertension: Secondary | ICD-10-CM | POA: Diagnosis not present

## 2021-05-24 DIAGNOSIS — E119 Type 2 diabetes mellitus without complications: Secondary | ICD-10-CM | POA: Diagnosis not present

## 2021-05-24 DIAGNOSIS — F419 Anxiety disorder, unspecified: Secondary | ICD-10-CM | POA: Diagnosis not present

## 2021-06-21 DIAGNOSIS — I10 Essential (primary) hypertension: Secondary | ICD-10-CM | POA: Diagnosis not present

## 2021-06-21 DIAGNOSIS — F419 Anxiety disorder, unspecified: Secondary | ICD-10-CM | POA: Diagnosis not present

## 2021-06-30 DIAGNOSIS — F419 Anxiety disorder, unspecified: Secondary | ICD-10-CM | POA: Diagnosis not present

## 2021-06-30 DIAGNOSIS — R202 Paresthesia of skin: Secondary | ICD-10-CM | POA: Diagnosis not present

## 2021-08-01 DIAGNOSIS — I10 Essential (primary) hypertension: Secondary | ICD-10-CM | POA: Diagnosis not present

## 2021-08-01 DIAGNOSIS — R202 Paresthesia of skin: Secondary | ICD-10-CM | POA: Diagnosis not present

## 2021-08-01 DIAGNOSIS — R0989 Other specified symptoms and signs involving the circulatory and respiratory systems: Secondary | ICD-10-CM | POA: Diagnosis not present

## 2021-08-17 DIAGNOSIS — E119 Type 2 diabetes mellitus without complications: Secondary | ICD-10-CM | POA: Diagnosis not present

## 2021-08-17 DIAGNOSIS — F419 Anxiety disorder, unspecified: Secondary | ICD-10-CM | POA: Diagnosis not present

## 2021-08-17 DIAGNOSIS — G629 Polyneuropathy, unspecified: Secondary | ICD-10-CM | POA: Diagnosis not present

## 2021-08-17 DIAGNOSIS — E785 Hyperlipidemia, unspecified: Secondary | ICD-10-CM | POA: Diagnosis not present

## 2021-08-17 DIAGNOSIS — I129 Hypertensive chronic kidney disease with stage 1 through stage 4 chronic kidney disease, or unspecified chronic kidney disease: Secondary | ICD-10-CM | POA: Diagnosis not present

## 2021-08-17 DIAGNOSIS — N1831 Chronic kidney disease, stage 3a: Secondary | ICD-10-CM | POA: Diagnosis not present

## 2021-08-30 DIAGNOSIS — M545 Low back pain, unspecified: Secondary | ICD-10-CM | POA: Diagnosis not present

## 2021-08-30 DIAGNOSIS — R300 Dysuria: Secondary | ICD-10-CM | POA: Diagnosis not present

## 2021-08-30 DIAGNOSIS — E875 Hyperkalemia: Secondary | ICD-10-CM | POA: Diagnosis not present

## 2021-08-30 DIAGNOSIS — E785 Hyperlipidemia, unspecified: Secondary | ICD-10-CM | POA: Diagnosis not present

## 2021-08-30 DIAGNOSIS — M79605 Pain in left leg: Secondary | ICD-10-CM | POA: Diagnosis not present

## 2021-09-14 DIAGNOSIS — M792 Neuralgia and neuritis, unspecified: Secondary | ICD-10-CM | POA: Diagnosis not present

## 2021-10-02 DIAGNOSIS — F411 Generalized anxiety disorder: Secondary | ICD-10-CM | POA: Diagnosis not present

## 2021-10-02 DIAGNOSIS — Z760 Encounter for issue of repeat prescription: Secondary | ICD-10-CM | POA: Diagnosis not present

## 2021-10-02 DIAGNOSIS — F39 Unspecified mood [affective] disorder: Secondary | ICD-10-CM | POA: Diagnosis not present

## 2021-10-06 DIAGNOSIS — R202 Paresthesia of skin: Secondary | ICD-10-CM | POA: Diagnosis not present

## 2021-10-06 DIAGNOSIS — M545 Low back pain, unspecified: Secondary | ICD-10-CM | POA: Diagnosis not present

## 2021-10-31 DIAGNOSIS — R051 Acute cough: Secondary | ICD-10-CM | POA: Diagnosis not present

## 2021-10-31 DIAGNOSIS — G4700 Insomnia, unspecified: Secondary | ICD-10-CM | POA: Diagnosis not present

## 2021-10-31 DIAGNOSIS — F39 Unspecified mood [affective] disorder: Secondary | ICD-10-CM | POA: Diagnosis not present

## 2021-10-31 DIAGNOSIS — R059 Cough, unspecified: Secondary | ICD-10-CM | POA: Diagnosis not present

## 2021-10-31 DIAGNOSIS — Z760 Encounter for issue of repeat prescription: Secondary | ICD-10-CM | POA: Diagnosis not present

## 2021-11-17 DIAGNOSIS — R059 Cough, unspecified: Secondary | ICD-10-CM | POA: Diagnosis not present

## 2021-11-17 DIAGNOSIS — K219 Gastro-esophageal reflux disease without esophagitis: Secondary | ICD-10-CM | POA: Diagnosis not present

## 2021-11-21 DIAGNOSIS — J439 Emphysema, unspecified: Secondary | ICD-10-CM | POA: Diagnosis not present

## 2021-11-21 DIAGNOSIS — R052 Subacute cough: Secondary | ICD-10-CM | POA: Diagnosis not present

## 2021-11-21 DIAGNOSIS — R911 Solitary pulmonary nodule: Secondary | ICD-10-CM | POA: Diagnosis not present

## 2021-11-21 DIAGNOSIS — R0600 Dyspnea, unspecified: Secondary | ICD-10-CM | POA: Diagnosis not present

## 2021-12-29 DIAGNOSIS — R052 Subacute cough: Secondary | ICD-10-CM | POA: Diagnosis not present

## 2023-02-02 DEATH — deceased
